# Patient Record
Sex: Male | Born: 1965 | Hispanic: No | Marital: Married | Smoking: Current every day smoker
Health system: Southern US, Community
[De-identification: ages and names within clinical notes are randomized; demographics above are authoritative.]

## PROBLEM LIST (undated history)

## (undated) DIAGNOSIS — K529 Noninfective gastroenteritis and colitis, unspecified: Secondary | ICD-10-CM

## (undated) HISTORY — PX: APPENDECTOMY: SHX54

---

## 2004-09-13 ENCOUNTER — Emergency Department (HOSPITAL_COMMUNITY): Admission: EM | Admit: 2004-09-13 | Discharge: 2004-09-13 | Payer: Self-pay | Admitting: Emergency Medicine

## 2004-09-29 ENCOUNTER — Ambulatory Visit: Payer: Self-pay | Admitting: Gastroenterology

## 2004-10-03 ENCOUNTER — Ambulatory Visit: Payer: Self-pay | Admitting: Gastroenterology

## 2006-03-22 ENCOUNTER — Ambulatory Visit: Payer: Self-pay | Admitting: Internal Medicine

## 2006-06-04 ENCOUNTER — Ambulatory Visit: Payer: Self-pay | Admitting: Internal Medicine

## 2006-06-04 LAB — CONVERTED CEMR LAB
ALT: 28 units/L (ref 0–40)
AST: 36 units/L (ref 0–37)
Albumin: 3.8 g/dL (ref 3.5–5.2)
Alkaline Phosphatase: 37 units/L — ABNORMAL LOW (ref 39–117)
BUN: 9 mg/dL (ref 6–23)
Basophils Absolute: 0 10*3/uL (ref 0.0–0.1)
Basophils Relative: 0 % (ref 0.0–1.0)
Bilirubin, Direct: 0.1 mg/dL (ref 0.0–0.3)
CO2: 30 meq/L (ref 19–32)
Calcium: 9.2 mg/dL (ref 8.4–10.5)
Chloride: 108 meq/L (ref 96–112)
Cholesterol: 219 mg/dL (ref 0–200)
Creatinine, Ser: 0.9 mg/dL (ref 0.4–1.5)
Direct LDL: 128.1 mg/dL
Eosinophils Absolute: 0.1 10*3/uL (ref 0.0–0.6)
Eosinophils Relative: 1.8 % (ref 0.0–5.0)
GFR calc Af Amer: 119 mL/min
GFR calc non Af Amer: 98 mL/min
Glucose, Bld: 103 mg/dL — ABNORMAL HIGH (ref 70–99)
HCT: 44.1 % (ref 39.0–52.0)
HDL: 53.9 mg/dL (ref >39.0)
Hemoglobin: 15.3 g/dL (ref 13.0–17.0)
Lymphocytes Relative: 27.1 % (ref 12.0–46.0)
MCHC: 34.7 g/dL (ref 30.0–36.0)
MCV: 92.7 fL (ref 78.0–100.0)
Monocytes Absolute: 0.5 10*3/uL (ref 0.2–0.7)
Monocytes Relative: 8.4 % (ref 3.0–11.0)
Neutro Abs: 3.9 10*3/uL (ref 1.4–7.7)
Neutrophils Relative %: 62.7 % (ref 43.0–77.0)
Platelets: 280 10*3/uL (ref 150–400)
Potassium: 4.1 meq/L (ref 3.5–5.1)
RBC: 4.75 M/uL (ref 4.22–5.81)
RDW: 14 % (ref 11.5–14.6)
Sodium: 141 meq/L (ref 135–145)
TSH: 2.06 microintl units/mL (ref 0.35–5.50)
Total Bilirubin: 0.9 mg/dL (ref 0.3–1.2)
Total CHOL/HDL Ratio: 4.1
Total Protein: 6.7 g/dL (ref 6.0–8.3)
Triglycerides: 180 mg/dL — ABNORMAL HIGH (ref 0–149)
VLDL: 36 mg/dL (ref 0–40)
WBC: 6.2 10*3/uL (ref 4.5–10.5)

## 2006-06-11 ENCOUNTER — Ambulatory Visit: Payer: Self-pay | Admitting: Internal Medicine

## 2006-06-11 DIAGNOSIS — E785 Hyperlipidemia, unspecified: Secondary | ICD-10-CM | POA: Insufficient documentation

## 2006-06-11 DIAGNOSIS — M109 Gout, unspecified: Secondary | ICD-10-CM

## 2006-09-06 DIAGNOSIS — F17200 Nicotine dependence, unspecified, uncomplicated: Secondary | ICD-10-CM | POA: Insufficient documentation

## 2007-06-11 ENCOUNTER — Encounter: Payer: Self-pay | Admitting: Internal Medicine

## 2009-03-22 ENCOUNTER — Emergency Department (HOSPITAL_COMMUNITY): Admission: EM | Admit: 2009-03-22 | Discharge: 2009-03-22 | Payer: Self-pay | Admitting: Emergency Medicine

## 2009-04-01 ENCOUNTER — Ambulatory Visit (HOSPITAL_COMMUNITY): Admission: RE | Admit: 2009-04-01 | Discharge: 2009-04-01 | Payer: Self-pay | Admitting: Specialist

## 2009-04-30 ENCOUNTER — Ambulatory Visit (HOSPITAL_BASED_OUTPATIENT_CLINIC_OR_DEPARTMENT_OTHER): Admission: RE | Admit: 2009-04-30 | Discharge: 2009-04-30 | Payer: Self-pay | Admitting: Specialist

## 2010-03-03 ENCOUNTER — Emergency Department (HOSPITAL_COMMUNITY): Payer: Managed Care, Other (non HMO)

## 2010-03-03 ENCOUNTER — Emergency Department (HOSPITAL_COMMUNITY)
Admission: EM | Admit: 2010-03-03 | Discharge: 2010-03-03 | Disposition: A | Discharge status: Home / Self Care | Payer: Managed Care, Other (non HMO) | Attending: Emergency Medicine | Admitting: Emergency Medicine

## 2010-03-03 DIAGNOSIS — G510 Bell's palsy: Secondary | ICD-10-CM | POA: Insufficient documentation

## 2010-03-03 DIAGNOSIS — R209 Unspecified disturbances of skin sensation: Secondary | ICD-10-CM | POA: Insufficient documentation

## 2010-03-03 DIAGNOSIS — R29810 Facial weakness: Secondary | ICD-10-CM | POA: Insufficient documentation

## 2010-03-03 LAB — CBC
MCHC: 33.7 g/dL (ref 30.0–36.0)
MCV: 96.8 fL (ref 78.0–100.0)
RBC: 4.44 MIL/uL (ref 4.22–5.81)
WBC: 8.3 10*3/uL (ref 4.0–10.5)

## 2010-03-03 LAB — APTT: aPTT: 26 seconds (ref 24–37)

## 2010-03-03 LAB — COMPREHENSIVE METABOLIC PANEL
ALT: 19 U/L (ref 0–53)
AST: 22 U/L (ref 0–37)
CO2: 24 mEq/L (ref 19–32)
Calcium: 9.2 mg/dL (ref 8.4–10.5)
Creatinine, Ser: 0.95 mg/dL (ref 0.4–1.5)
Glucose, Bld: 89 mg/dL (ref 70–99)
Sodium: 138 mEq/L (ref 135–145)

## 2010-03-03 LAB — DIFFERENTIAL
Eosinophils Relative: 2 % (ref 0–5)
Lymphocytes Relative: 28 % (ref 12–46)
Neutro Abs: 5.2 10*3/uL (ref 1.7–7.7)

## 2010-03-03 LAB — PROTIME-INR: INR: 0.97 (ref 0.00–1.49)

## 2010-04-13 LAB — POCT HEMOGLOBIN-HEMACUE: Hemoglobin: 17.2 g/dL — ABNORMAL HIGH (ref 13.0–17.0)

## 2010-04-14 LAB — SYNOVIAL CELL COUNT + DIFF, W/ CRYSTALS
Lymphocytes-Synovial Fld: 9 % (ref 0–20)
Monocyte-Macrophage-Synovial Fluid: 2 % — ABNORMAL LOW (ref 50–90)
Neutrophil, Synovial: 89 % — ABNORMAL HIGH (ref 0–25)

## 2010-04-14 LAB — BODY FLUID CULTURE: Culture: NO GROWTH

## 2010-04-23 ENCOUNTER — Emergency Department (HOSPITAL_COMMUNITY)
Admission: EM | Admit: 2010-04-23 | Discharge: 2010-04-23 | Disposition: A | Discharge status: Home / Self Care | Payer: Managed Care, Other (non HMO) | Attending: Emergency Medicine | Admitting: Emergency Medicine

## 2010-04-23 DIAGNOSIS — IMO0002 Reserved for concepts with insufficient information to code with codable children: Secondary | ICD-10-CM | POA: Insufficient documentation

## 2010-04-23 DIAGNOSIS — S91309A Unspecified open wound, unspecified foot, initial encounter: Secondary | ICD-10-CM | POA: Insufficient documentation

## 2010-04-23 DIAGNOSIS — Y92009 Unspecified place in unspecified non-institutional (private) residence as the place of occurrence of the external cause: Secondary | ICD-10-CM | POA: Insufficient documentation

## 2011-03-30 ENCOUNTER — Encounter (HOSPITAL_COMMUNITY): Payer: Self-pay | Admitting: Nurse Practitioner

## 2011-03-30 ENCOUNTER — Encounter (HOSPITAL_COMMUNITY): Admission: EM | Disposition: A | Discharge status: Home / Self Care | Payer: Self-pay | Source: Ambulatory Visit

## 2011-03-30 ENCOUNTER — Encounter (HOSPITAL_COMMUNITY): Payer: Self-pay | Admitting: Certified Registered Nurse Anesthetist

## 2011-03-30 ENCOUNTER — Emergency Department (HOSPITAL_COMMUNITY): Payer: Managed Care, Other (non HMO)

## 2011-03-30 ENCOUNTER — Emergency Department (HOSPITAL_COMMUNITY): Payer: Managed Care, Other (non HMO) | Admitting: Certified Registered Nurse Anesthetist

## 2011-03-30 ENCOUNTER — Inpatient Hospital Stay (HOSPITAL_COMMUNITY)
Admission: EM | Admit: 2011-03-30 | Discharge: 2011-04-03 | DRG: 340 | Disposition: A | Discharge status: Home / Self Care | Payer: Managed Care, Other (non HMO) | Source: Ambulatory Visit | Attending: Surgery | Admitting: Surgery

## 2011-03-30 DIAGNOSIS — K37 Unspecified appendicitis: Secondary | ICD-10-CM

## 2011-03-30 DIAGNOSIS — K3580 Unspecified acute appendicitis: Secondary | ICD-10-CM

## 2011-03-30 DIAGNOSIS — K352 Acute appendicitis with generalized peritonitis, without abscess: Principal | ICD-10-CM | POA: Diagnosis present

## 2011-03-30 DIAGNOSIS — K35209 Acute appendicitis with generalized peritonitis, without abscess, unspecified as to perforation: Principal | ICD-10-CM | POA: Diagnosis present

## 2011-03-30 DIAGNOSIS — R300 Dysuria: Secondary | ICD-10-CM | POA: Diagnosis present

## 2011-03-30 DIAGNOSIS — K3532 Acute appendicitis with perforation and localized peritonitis, without abscess: Secondary | ICD-10-CM | POA: Diagnosis present

## 2011-03-30 HISTORY — PX: LAPAROSCOPIC APPENDECTOMY: SHX408

## 2011-03-30 HISTORY — DX: Noninfective gastroenteritis and colitis, unspecified: K52.9

## 2011-03-30 LAB — URINALYSIS, ROUTINE W REFLEX MICROSCOPIC
Bilirubin Urine: NEGATIVE
Hgb urine dipstick: NEGATIVE
Ketones, ur: NEGATIVE mg/dL
Nitrite: NEGATIVE
Specific Gravity, Urine: 1.026 (ref 1.005–1.030)
pH: 5.5 (ref 5.0–8.0)

## 2011-03-30 LAB — POCT I-STAT, CHEM 8
Calcium, Ion: 1.14 mmol/L (ref 1.12–1.32)
Chloride: 108 mEq/L (ref 96–112)
Glucose, Bld: 94 mg/dL (ref 70–99)
TCO2: 24 mmol/L (ref 0–100)

## 2011-03-30 LAB — DIFFERENTIAL
Basophils Relative: 0 % (ref 0–1)
Eosinophils Relative: 2 % (ref 0–5)
Monocytes Absolute: 0.8 10*3/uL (ref 0.1–1.0)
Monocytes Relative: 8 % (ref 3–12)
Neutro Abs: 7.3 10*3/uL (ref 1.7–7.7)

## 2011-03-30 LAB — CBC
HCT: 40.2 % (ref 39.0–52.0)
Hemoglobin: 13 g/dL (ref 13.0–17.0)
MCH: 30.8 pg (ref 26.0–34.0)
MCH: 31.3 pg (ref 26.0–34.0)
MCHC: 32.3 g/dL (ref 30.0–36.0)
MCHC: 32.9 g/dL (ref 30.0–36.0)
MCV: 95.3 fL (ref 78.0–100.0)
MCV: 95.2 fL (ref 78.0–100.0)
Platelets: 252 10*3/uL (ref 150–400)
RBC: 4.18 MIL/uL — ABNORMAL LOW (ref 4.22–5.81)

## 2011-03-30 SURGERY — APPENDECTOMY, LAPAROSCOPIC
Anesthesia: General | Site: Abdomen | Wound class: Dirty or Infected

## 2011-03-30 MED ORDER — HYDROMORPHONE HCL PF 1 MG/ML IJ SOLN
INTRAMUSCULAR | Status: AC
  Administered 2011-03-30: 0.5 mg via INTRAVENOUS
  Filled 2011-03-30: qty 1

## 2011-03-30 MED ORDER — ROCURONIUM BROMIDE 100 MG/10ML IV SOLN
INTRAVENOUS | Status: DC | PRN
  Administered 2011-03-30: 10 mg via INTRAVENOUS
  Administered 2011-03-30: 25 mg via INTRAVENOUS

## 2011-03-30 MED ORDER — HYDROMORPHONE HCL PF 1 MG/ML IJ SOLN
0.5000 mg | INTRAMUSCULAR | Status: DC | PRN
  Administered 2011-03-31 – 2011-04-02 (×8): 1 mg via INTRAVENOUS
  Filled 2011-03-30 (×8): qty 1

## 2011-03-30 MED ORDER — MIDAZOLAM HCL 5 MG/5ML IJ SOLN
INTRAMUSCULAR | Status: DC | PRN
  Administered 2011-03-30: 2 mg via INTRAVENOUS

## 2011-03-30 MED ORDER — EPHEDRINE SULFATE 50 MG/ML IJ SOLN
INTRAMUSCULAR | Status: DC | PRN
  Administered 2011-03-30 (×2): 5 mg via INTRAVENOUS

## 2011-03-30 MED ORDER — ONDANSETRON HCL 4 MG/2ML IJ SOLN: 4.0000 mg | Freq: Four times a day (QID) | INTRAMUSCULAR | Status: DC | PRN

## 2011-03-30 MED ORDER — SODIUM CHLORIDE 0.9 % IR SOLN
Status: DC | PRN
  Administered 2011-03-30: 3000 mL

## 2011-03-30 MED ORDER — LIDOCAINE HCL 4 % MT SOLN
OROMUCOSAL | Status: DC | PRN
  Administered 2011-03-30: 4 mL via TOPICAL

## 2011-03-30 MED ORDER — BUPIVACAINE-EPINEPHRINE 0.25% -1:200000 IJ SOLN
INTRAMUSCULAR | Status: DC | PRN
  Administered 2011-03-30: 20 mL

## 2011-03-30 MED ORDER — MORPHINE SULFATE 4 MG/ML IJ SOLN
4.0000 mg | Freq: Once | INTRAMUSCULAR | Status: AC
  Administered 2011-03-30: 4 mg via INTRAVENOUS
  Filled 2011-03-30: qty 1

## 2011-03-30 MED ORDER — NEOSTIGMINE METHYLSULFATE 1 MG/ML IJ SOLN
INTRAMUSCULAR | Status: DC | PRN
  Administered 2011-03-30: 2.5 mg via INTRAVENOUS

## 2011-03-30 MED ORDER — SUCCINYLCHOLINE CHLORIDE 20 MG/ML IJ SOLN
INTRAMUSCULAR | Status: DC | PRN
  Administered 2011-03-30: 120 mg via INTRAVENOUS

## 2011-03-30 MED ORDER — SODIUM CHLORIDE 0.9 % IR SOLN
Status: DC | PRN
  Administered 2011-03-30: 1000 mL

## 2011-03-30 MED ORDER — NICOTINE 21 MG/24HR TD PT24
21.0000 mg | MEDICATED_PATCH | Freq: Every day | TRANSDERMAL | Status: DC | PRN
  Filled 2011-03-30 (×2): qty 1

## 2011-03-30 MED ORDER — FENTANYL CITRATE 0.05 MG/ML IJ SOLN
INTRAMUSCULAR | Status: DC | PRN
  Administered 2011-03-30 (×2): 50 ug via INTRAVENOUS
  Administered 2011-03-30: 100 ug via INTRAVENOUS
  Administered 2011-03-30: 50 ug via INTRAVENOUS
  Administered 2011-03-30: 100 ug via INTRAVENOUS
  Administered 2011-03-30 (×3): 50 ug via INTRAVENOUS

## 2011-03-30 MED ORDER — SODIUM CHLORIDE 0.9 % IV SOLN
1.0000 g | INTRAVENOUS | Status: DC | PRN
  Administered 2011-03-30: 1 g via INTRAVENOUS

## 2011-03-30 MED ORDER — IOHEXOL 300 MG/ML  SOLN
20.0000 mL | INTRAMUSCULAR | Status: AC
  Administered 2011-03-30 (×2): 20 mL via ORAL

## 2011-03-30 MED ORDER — SODIUM CHLORIDE 0.9 % IV SOLN
1.0000 g | INTRAVENOUS | Status: DC
  Administered 2011-03-31 – 2011-04-02 (×3): 1 g via INTRAVENOUS
  Filled 2011-03-30 (×4): qty 1

## 2011-03-30 MED ORDER — HEPARIN SODIUM (PORCINE) 5000 UNIT/ML IJ SOLN
5000.0000 [IU] | Freq: Three times a day (TID) | INTRAMUSCULAR | Status: DC
  Administered 2011-03-31 – 2011-04-03 (×10): 5000 [IU] via SUBCUTANEOUS
  Filled 2011-03-30 (×13): qty 1

## 2011-03-30 MED ORDER — HYDROMORPHONE HCL PF 1 MG/ML IJ SOLN
INTRAMUSCULAR | Status: AC
  Filled 2011-03-30: qty 1

## 2011-03-30 MED ORDER — ONDANSETRON HCL 4 MG PO TABS: 4.0000 mg | ORAL_TABLET | Freq: Four times a day (QID) | ORAL | Status: DC | PRN

## 2011-03-30 MED ORDER — HYDROMORPHONE HCL PF 1 MG/ML IJ SOLN
0.2500 mg | INTRAMUSCULAR | Status: DC | PRN
  Administered 2011-03-30 (×4): 0.5 mg via INTRAVENOUS

## 2011-03-30 MED ORDER — LACTATED RINGERS IV SOLN
INTRAVENOUS | Status: DC | PRN
  Administered 2011-03-30 (×2): via INTRAVENOUS

## 2011-03-30 MED ORDER — HYDROMORPHONE HCL PF 1 MG/ML IJ SOLN
0.5000 mg | INTRAMUSCULAR | Status: AC | PRN
  Administered 2011-03-30 (×4): 0.5 mg via INTRAVENOUS

## 2011-03-30 MED ORDER — PROPOFOL 10 MG/ML IV EMUL
INTRAVENOUS | Status: DC | PRN
  Administered 2011-03-30: 200 mg via INTRAVENOUS

## 2011-03-30 MED ORDER — HYDROMORPHONE HCL PF 1 MG/ML IJ SOLN
1.0000 mg | Freq: Once | INTRAMUSCULAR | Status: DC
  Filled 2011-03-30: qty 1

## 2011-03-30 MED ORDER — IOHEXOL 300 MG/ML  SOLN
100.0000 mL | Freq: Once | INTRAMUSCULAR | Status: AC | PRN
  Administered 2011-03-30: 100 mL via INTRAVENOUS

## 2011-03-30 MED ORDER — SODIUM CHLORIDE 0.9 % IV BOLUS (SEPSIS)
1000.0000 mL | Freq: Once | INTRAVENOUS | Status: AC
  Administered 2011-03-30: 1000 mL via INTRAVENOUS

## 2011-03-30 MED ORDER — OXYCODONE-ACETAMINOPHEN 5-325 MG PO TABS
1.0000 | ORAL_TABLET | ORAL | Status: DC | PRN
  Administered 2011-03-31 – 2011-04-03 (×10): 2 via ORAL
  Filled 2011-03-30 (×10): qty 2

## 2011-03-30 MED ORDER — ERTAPENEM SODIUM 1 G IJ SOLR
1.0000 g | INTRAMUSCULAR | Status: DC
  Filled 2011-03-30: qty 1

## 2011-03-30 MED ORDER — ONDANSETRON HCL 4 MG/2ML IJ SOLN
INTRAMUSCULAR | Status: DC | PRN
  Administered 2011-03-30: 4 mg via INTRAVENOUS

## 2011-03-30 MED ORDER — HYDRALAZINE HCL 20 MG/ML IJ SOLN
10.0000 mg | Freq: Four times a day (QID) | INTRAMUSCULAR | Status: DC | PRN
  Filled 2011-03-30: qty 0.5

## 2011-03-30 MED ORDER — GLYCOPYRROLATE 0.2 MG/ML IJ SOLN
INTRAMUSCULAR | Status: DC | PRN
  Administered 2011-03-30: .4 mg via INTRAVENOUS

## 2011-03-30 MED ORDER — DEXTROSE IN LACTATED RINGERS 5 % IV SOLN
INTRAVENOUS | Status: DC
  Administered 2011-03-30 – 2011-04-02 (×8): via INTRAVENOUS

## 2011-03-30 SURGICAL SUPPLY — 54 items
ADH SKN CLS APL DERMABOND .7 (GAUZE/BANDAGES/DRESSINGS) ×1
ADH SKN CLS LQ APL DERMABOND (GAUZE/BANDAGES/DRESSINGS) ×1
APPLIER CLIP ROT 10 11.4 M/L (STAPLE)
APR CLP MED LRG 11.4X10 (STAPLE)
BAG SPEC RTRVL LRG 6X4 10 (ENDOMECHANICALS) ×1
BLADE SURG ROTATE 9660 (MISCELLANEOUS) ×1 IMPLANT
CANISTER SUCTION 2500CC (MISCELLANEOUS) ×2 IMPLANT
CHLORAPREP W/TINT 26ML (MISCELLANEOUS) ×2 IMPLANT
CLIP APPLIE ROT 10 11.4 M/L (STAPLE) IMPLANT
CLOTH BEACON ORANGE TIMEOUT ST (SAFETY) ×2 IMPLANT
COVER SURGICAL LIGHT HANDLE (MISCELLANEOUS) ×2 IMPLANT
CUTTER LINEAR ENDO 35 ETS (STAPLE) IMPLANT
CUTTER LINEAR ENDO 35 ETS TH (STAPLE) ×1 IMPLANT
DECANTER SPIKE VIAL GLASS SM (MISCELLANEOUS) ×2 IMPLANT
DERMABOND ADHESIVE PROPEN (GAUZE/BANDAGES/DRESSINGS) ×1
DERMABOND ADVANCED (GAUZE/BANDAGES/DRESSINGS) ×1
DERMABOND ADVANCED .7 DNX12 (GAUZE/BANDAGES/DRESSINGS) ×1 IMPLANT
DERMABOND ADVANCED .7 DNX6 (GAUZE/BANDAGES/DRESSINGS) IMPLANT
DRAIN CHANNEL 19F RND (DRAIN) ×1 IMPLANT
DRAPE UTILITY 15X26 W/TAPE STR (DRAPE) ×6 IMPLANT
ELECT REM PT RETURN 9FT ADLT (ELECTROSURGICAL) ×2
ELECTRODE REM PT RTRN 9FT ADLT (ELECTROSURGICAL) ×1 IMPLANT
ENDOLOOP SUT PDS II  0 18 (SUTURE)
ENDOLOOP SUT PDS II 0 18 (SUTURE) IMPLANT
EVACUATOR SILICONE 100CC (DRAIN) ×1 IMPLANT
GLOVE BIO SURGEON STRL SZ7 (GLOVE) ×3 IMPLANT
GLOVE BIO SURGEON STRL SZ8 (GLOVE) ×3 IMPLANT
GLOVE BIOGEL PI IND STRL 7.5 (GLOVE) IMPLANT
GLOVE BIOGEL PI IND STRL 8 (GLOVE) ×1 IMPLANT
GLOVE BIOGEL PI INDICATOR 7.5 (GLOVE) ×2
GLOVE BIOGEL PI INDICATOR 8 (GLOVE) ×1
GOWN PREVENTION PLUS XLARGE (GOWN DISPOSABLE) ×3 IMPLANT
GOWN STRL NON-REIN LRG LVL3 (GOWN DISPOSABLE) ×4 IMPLANT
KIT BASIN OR (CUSTOM PROCEDURE TRAY) ×2 IMPLANT
KIT ROOM TURNOVER OR (KITS) ×2 IMPLANT
NS IRRIG 1000ML POUR BTL (IV SOLUTION) ×2 IMPLANT
PAD ARMBOARD 7.5X6 YLW CONV (MISCELLANEOUS) ×3 IMPLANT
POUCH SPECIMEN RETRIEVAL 10MM (ENDOMECHANICALS) ×2 IMPLANT
RELOAD /EVU35 (ENDOMECHANICALS) IMPLANT
RELOAD CUTTER ETS 35MM STAND (ENDOMECHANICALS) IMPLANT
SCALPEL HARMONIC ACE (MISCELLANEOUS) ×2 IMPLANT
SET IRRIG TUBING LAPAROSCOPIC (IRRIGATION / IRRIGATOR) ×2 IMPLANT
SPECIMEN JAR SMALL (MISCELLANEOUS) ×2 IMPLANT
SUT VIC AB 4-0 PS2 27 (SUTURE) ×2 IMPLANT
SWAB COLLECTION DEVICE MRSA (MISCELLANEOUS) IMPLANT
TOWEL OR 17X24 6PK STRL BLUE (TOWEL DISPOSABLE) ×2 IMPLANT
TOWEL OR 17X26 10 PK STRL BLUE (TOWEL DISPOSABLE) ×2 IMPLANT
TRAY FOLEY CATH 14FR (SET/KITS/TRAYS/PACK) ×2 IMPLANT
TRAY LAPAROSCOPIC (CUSTOM PROCEDURE TRAY) ×2 IMPLANT
TROCAR HASSON GELL 12X100 (TROCAR) ×2 IMPLANT
TROCAR Z-THREAD FIOS 12X100MM (TROCAR) ×2 IMPLANT
TROCAR Z-THREAD FIOS 5X100MM (TROCAR) ×2 IMPLANT
TUBE ANAEROBIC SPECIMEN COL (MISCELLANEOUS) IMPLANT
WATER STERILE IRR 1000ML POUR (IV SOLUTION) ×1 IMPLANT

## 2011-03-30 NOTE — ED Provider Notes (Signed)
History     CSN: [id number]  Arrival date & time 03/30/11  1039   First MD Initiated Contact with Patient 03/30/11 1228      Chief Complaint  Patient presents with  . Abdominal Pain    (Consider location/radiation/quality/duration/timing/severity/associated sxs/prior treatment) The history is provided by the patient. No language interpreter was used.    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 Patient states for the past 10 days he has been experiencing gradual onset of abdominal pain. He described pain as a tight, squeezing, twisting sensation worsen with movement. pain comes and goes.  Pain was significant last night that he was unable to sleep.  Pain can last for seconds and at times lasting for hours. He has nausea without vomiting. Patient states he normally has regular bowel movement but for the past several days he has a bowel movement change. State he has decreased bowel movement, with watery diarrhea. Nonbloody. He denies any fever, chest pain, shortness of breath, back pain, dysuria, testicular pain, testicular swelling, or change in appetite. Denies noticing any hernia  Past Medical History  Diagnosis Date  . Gastroenteritis     History reviewed. No pertinent past surgical history.  History reviewed. No pertinent family history.  History  Substance Use Topics  . Smoking status: Current Everyday Smoker -- 1.0 packs/day  . Smokeless tobacco: Not on file  . Alcohol Use: 12.6 oz/week    21 Cans of beer per week      Review of Systems  All other systems reviewed and are negative.    Allergies  Review of patient's allergies indicates no known allergies.  Home Medications  No current outpatient prescriptions on file.  BP 124/72  Pulse 96  Temp(Src) 98.4 F (36.9 C) (Oral)  Resp 20  Ht 5\' 9"  (1.753 m)  Wt 170 lb (77.111 kg)  BMI 25.10 kg/m2  SpO2 98%  Physical Exam  Nursing  note and vitals reviewed. Constitutional: He appears well-developed and well-nourished. No distress.       Awake, alert, nontoxic appearance  HENT:  Head: Atraumatic.  Eyes: Conjunctivae are normal. Right eye exhibits no discharge. Left eye exhibits no discharge.  Neck: Normal range of motion. Neck supple.  Cardiovascular: Normal rate and regular rhythm.   Pulmonary/Chest: Effort normal. No respiratory distress. He exhibits no tenderness.  Abdominal: Soft. There is tenderness in the right lower quadrant, suprapubic area and left lower quadrant. There is guarding. There is no rigidity, no rebound, no tenderness at McBurney's point and negative Murphy's sign. No hernia. Hernia confirmed negative in the ventral area, confirmed negative in the right inguinal area and confirmed negative in the left inguinal area.  Genitourinary: Testes normal and penis normal.    Circumcised.  Musculoskeletal: He exhibits no tenderness.       ROM appears intact, no obvious focal weakness  Neurological: He is alert.  Skin: Skin is warm and dry. No rash noted.  Psychiatric: He has a normal mood and affect.    ED Course  Procedures (including critical care time)   Labs Reviewed  URINALYSIS, ROUTINE W REFLEX MICROSCOPIC   No results found.   No diagnosis found.  Results for orders placed during the hospital encounter of 03/30/11  URINALYSIS, ROUTINE W REFLEX MICROSCOPIC      Component Value Range   Color, Urine YELLOW  YELLOW    APPearance CLEAR  CLEAR    Specific Gravity, Urine 1.026  1.005 - 1.030    pH 5.5  5.0 - 8.0    Glucose, UA NEGATIVE  NEGATIVE (mg/dL)   Hgb urine dipstick NEGATIVE  NEGATIVE    Bilirubin Urine NEGATIVE  NEGATIVE    Ketones, ur NEGATIVE  NEGATIVE (mg/dL)   Protein, ur NEGATIVE  NEGATIVE (mg/dL)   Urobilinogen, UA 0.2  0.0 - 1.0 (mg/dL)   Nitrite NEGATIVE  NEGATIVE    Leukocytes, UA NEGATIVE  NEGATIVE   CBC      Component Value Range   WBC 9.9  4.0 - 10.5 (K/uL)   RBC  4.22  4.22 - 5.81 (MIL/uL)   Hemoglobin 13.0  13.0 - 17.0 (g/dL)   HCT 45.4  09.8 - 11.9 (%)   MCV 95.3  78.0 - 100.0 (fL)   MCH 30.8  26.0 - 34.0 (pg)   MCHC 32.3  30.0 - 36.0 (g/dL)   RDW 14.7  82.9 - 56.2 (%)   Platelets 291  150 - 400 (K/uL)  DIFFERENTIAL      Component Value Range   Neutrophils Relative 74  43 - 77 (%)   Neutro Abs 7.3  1.7 - 7.7 (K/uL)   Lymphocytes Relative 16  12 - 46 (%)   Lymphs Abs 1.6  0.7 - 4.0 (K/uL)   Monocytes Relative 8  3 - 12 (%)   Monocytes Absolute 0.8  0.1 - 1.0 (K/uL)   Eosinophils Relative 2  0 - 5 (%)   Eosinophils Absolute 0.2  0.0 - 0.7 (K/uL)   Basophils Relative 0  0 - 1 (%)   Basophils Absolute 0.0  0.0 - 0.1 (K/uL)  OCCULT BLOOD, POC DEVICE      Component Value Range   Fecal Occult Bld NEGATIVE    POCT I-STAT, CHEM 8      Component Value Range   Sodium 141  135 - 145 (mEq/L)   Potassium 3.9  3.5 - 5.1 (mEq/L)   Chloride 108  96 - 112 (mEq/L)   BUN 12  6 - 23 (mg/dL)   Creatinine, Ser 1.30  0.50 - 1.35 (mg/dL)   Glucose, Bld 94  70 - 99 (mg/dL)   Calcium, Ion 8.65  7.84 - 1.32 (mmol/L)   TCO2 24  0 - 100 (mmol/L)   Hemoglobin 13.3  13.0 - 17.0 (g/dL)   HCT 69.6  29.5 - 28.4 (%)  CBC      Component Value Range   WBC 7.3  4.0 - 10.5 (K/uL)   RBC 4.18 (*) 4.22 - 5.81 (MIL/uL)   Hemoglobin 13.1  13.0 - 17.0 (g/dL)   HCT 13.2  44.0 - 10.2 (%)   MCV 95.2  78.0 - 100.0 (fL)   MCH 31.3  26.0 - 34.0 (pg)   MCHC 32.9  30.0 - 36.0 (g/dL)   RDW 72.5  36.6 - 44.0 (%)   Platelets 252  150 - 400 (K/uL)  CREATININE, SERUM      Component Value Range   Creatinine, Ser 0.92  0.50 - 1.35 (mg/dL)   GFR calc non Af Amer >90  >90 (mL/min)   GFR calc Af Amer >90  >90 (mL/min)  BASIC METABOLIC PANEL      Component Value Range   Sodium 138  135 - 145 (mEq/L)   Potassium 4.2  3.5 - 5.1 (mEq/L)   Chloride 105  96 - 112 (mEq/L)   CO2 26  19 - 32 (mEq/L)   Glucose, Bld 131 (*) 70 - 99 (mg/dL)  BUN 7  6 - 23 (mg/dL)   Creatinine, Ser 7.82   0.50 - 1.35 (mg/dL)   Calcium 8.4  8.4 - 95.6 (mg/dL)   GFR calc non Af Amer >90  >90 (mL/min)   GFR calc Af Amer >90  >90 (mL/min)  CBC      Component Value Range   WBC 7.2  4.0 - 10.5 (K/uL)   RBC 3.99 (*) 4.22 - 5.81 (MIL/uL)   Hemoglobin 12.3 (*) 13.0 - 17.0 (g/dL)   HCT 21.3 (*) 08.6 - 52.0 (%)   MCV 95.5  78.0 - 100.0 (fL)   MCH 30.8  26.0 - 34.0 (pg)   MCHC 32.3  30.0 - 36.0 (g/dL)   RDW 57.8  46.9 - 62.9 (%)   Platelets 252  150 - 400 (K/uL)   Ct Abdomen Pelvis W Contrast  03/30/2011  *RADIOLOGY REPORT*  Clinical Data: Lower abdominal pain.  Change in bowel movements.  CT ABDOMEN AND PELVIS WITH CONTRAST  Technique:  Multidetector CT imaging of the abdomen and pelvis was performed following the standard protocol during bolus administration of intravenous contrast.  Contrast: OMNIPAQUE IOHEXOL 300 MG/ML IJ SOLN  Comparison: No priors.  Findings:  Lung Bases: Unremarkable.  Abdomen/Pelvis:  The enhanced appearance of the liver, gallbladder, pancreas, spleen, bilateral adrenal glands and the left kidney is unremarkable.  In the interpolar collecting system of the right kidney there is a 2 mm nonobstructive calculus.  As well, there is a small exophytic intermediate attenuation (28 HU) lesion measuring 7 mm in diameter extending off the upper pole of the right kidney (too small to characterize).  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  The appendix appears markedly dilated and inflamed, and the distal aspect and tip of the appendix does not demonstrate normal enhancement, suggesting ischemia.  The distal aspect of the appendix measures up to 27 mm in diameter, and is surrounded by a large amount of adjacent inflammatory changes that resulting in thickening of the adjacent terminal ileum and the adjacent portions of the cecum.  Inflammatory changes are also noted in ascending the right pericolic gutter.  Extensive  ileocolic reactive adenopathy is also noted.  There is some surrounding periappendiceal fluid, but no well deformed abscess yet at this time.  No gross pneumoperitoneum.  No pathologic distension of bowel.  Other than the small amount of fluid the peritendinous heel region, there is no significant volume of ascites. Urinary bladder is unremarkable.  Musculoskeletal: There are no aggressive appearing lytic or blastic lesions noted in the visualized portions of the skeleton.  IMPRESSION: 1.  Findings compatible with severe acute appendicitis. Appearance could be compatible with recent rupture, although there is no gross pneumoperitoneum and no frank abscess yet at this time.  Emergent surgical consultation is highly recommended. 2.  Atherosclerosis. 3.  Nonobstructive 2 mm calculus in the lower pole collecting system of the right kidney. 4.  7 mm intermediate attenuation lesion in the right kidney is too small to characterize.  Critical Value/emergent results were called by telephone at the time of interpretation on 03/30/2011  at 04:57 p.m.  to  Dr. Doylene Canard in the Emergency Department, who verbally acknowledged these results.  Original Report Authenticated By: Florencia Reasons, M.D.       MDM  Lower abd pain, ddx hernia, torsion, diverticular disease, appy, UTI.  Doubt cardiopulmonary etiology.  Pt report given to Dr. Alto Denver at end of shift.  Plan to  reevaluate after CT scan result and dispo as appropriate.  Pt in NAD.  Afebrile with stable vital sign.   8:32 AM Pt has evidence of acute appendicitis requiring surgical intervention.       Fayrene Helper, PA-C 03/31/11 [phone]

## 2011-03-30 NOTE — ED Notes (Signed)
Pt taken to surgery via stretcher. 

## 2011-03-30 NOTE — Anesthesia Postprocedure Evaluation (Signed)
Anesthesia Post Note  Patient: Connor Hunt  Procedure(s) Performed: Procedure(s) (LRB): APPENDECTOMY LAPAROSCOPIC (N/A)  Anesthesia type: General  Patient location: PACU  Post pain: Pain level controlled and Adequate analgesia  Post assessment: Post-op Vital signs reviewed, Patient's Cardiovascular Status Stable, Respiratory Function Stable, Patent Airway and Pain level controlled  Last Vitals:  Filed Vitals:   03/30/11 2215  BP:   Pulse: 84  Temp: 36.4 C  Resp: 14    Post vital signs: Reviewed and stable  Level of consciousness: awake, alert  and oriented  Complications: No apparent anesthesia complications

## 2011-03-30 NOTE — H&P (Addendum)
Connor Hunt is an 46 y.o. male.   Chief Complaint: Right lower quadrant abdominal pain HPI: Patient presented to the emergency department today with a ten-day history of right lower quadrant abdominal pain. He has not had significant associated nausea. He has been having diarrhea. His appetite has been fairly normal. The pain continued to worsen, especially bad last night. He was evaluated in the emergency department. CT scan of the abdomen and pelvis demonstrates acute appendicitis.  Past Medical History  Diagnosis Date  . Gastroenteritis     PSHx: Knee surgery  History reviewed. No pertinent family history. Social History:  reports that he has been smoking.  He does not have any smokeless tobacco history on file. He reports that he drinks about 12.6 ounces of alcohol per week. He reports that he does not use illicit drugs.  Allergies: No Known Allergies  Medications Prior to Admission  Medication Dose Route Frequency Provider Last Rate Last Dose  . HYDROmorphone (DILAUDID) injection 1 mg  1 mg Intravenous Once Meagan Hunt, MD      . iohexol (OMNIPAQUE) 300 MG/ML solution 100 mL  100 mL Intravenous Once PRN Medication Radiologist, MD   100 mL at 03/30/11 1648  . iohexol (OMNIPAQUE) 300 MG/ML solution 20 mL  20 mL Oral Q1 Hr x 2 Medication Radiologist, MD   20 mL at 03/30/11 1430  . lactated ringers infusion    Continuous PRN Glendora Score, CRNA      . morphine 4 MG/ML injection 4 mg  4 mg Intravenous Once Fayrene Helper, PA-C   4 mg at 03/30/11 1500  . nicotine (NICODERM CQ - dosed in mg/24 hours) patch 21 mg  21 mg Transdermal Daily PRN Cyndra Numbers, MD      . sodium chloride 0.9 % bolus 1,000 mL  1,000 mL Intravenous Once Fayrene Helper, PA-C   1,000 mL at 03/30/11 1230  . DISCONTD: HYDROmorphone (DILAUDID) 1 MG/ML injection            No current outpatient prescriptions on file as of 03/30/2011.    Results for orders placed during the hospital encounter of 03/30/11 (from the past 48 hour(s))   CBC     Status: Normal   Collection Time   03/30/11  1:23 PM      Component Value Range Comment   WBC 9.9  4.0 - 10.5 (K/uL)    RBC 4.22  4.22 - 5.81 (MIL/uL)    Hemoglobin 13.0  13.0 - 17.0 (g/dL)    HCT 16.1  09.6 - 04.5 (%)    MCV 95.3  78.0 - 100.0 (fL)    MCH 30.8  26.0 - 34.0 (pg)    MCHC 32.3  30.0 - 36.0 (g/dL)    RDW 40.9  81.1 - 91.4 (%)    Platelets 291  150 - 400 (K/uL)   DIFFERENTIAL     Status: Normal   Collection Time   03/30/11  1:23 PM      Component Value Range Comment   Neutrophils Relative 74  43 - 77 (%)    Neutro Abs 7.3  1.7 - 7.7 (K/uL)    Lymphocytes Relative 16  12 - 46 (%)    Lymphs Abs 1.6  0.7 - 4.0 (K/uL)    Monocytes Relative 8  3 - 12 (%)    Monocytes Absolute 0.8  0.1 - 1.0 (K/uL)    Eosinophils Relative 2  0 - 5 (%)    Eosinophils Absolute 0.2  0.0 -  0.7 (K/uL)    Basophils Relative 0  0 - 1 (%)    Basophils Absolute 0.0  0.0 - 0.1 (K/uL)   OCCULT BLOOD, POC DEVICE     Status: Normal   Collection Time   03/30/11  1:30 PM      Component Value Range Comment   Fecal Occult Bld NEGATIVE     POCT I-STAT, CHEM 8     Status: Normal   Collection Time   03/30/11  1:42 PM      Component Value Range Comment   Sodium 141  135 - 145 (mEq/L)    Potassium 3.9  3.5 - 5.1 (mEq/L)    Chloride 108  96 - 112 (mEq/L)    BUN 12  6 - 23 (mg/dL)    Creatinine, Ser 4.09  0.50 - 1.35 (mg/dL)    Glucose, Bld 94  70 - 99 (mg/dL)    Calcium, Ion 8.11  1.12 - 1.32 (mmol/L)    TCO2 24  0 - 100 (mmol/L)    Hemoglobin 13.3  13.0 - 17.0 (g/dL)    HCT 91.4  78.2 - 95.6 (%)   URINALYSIS, ROUTINE W REFLEX MICROSCOPIC     Status: Normal   Collection Time   03/30/11  2:08 PM      Component Value Range Comment   Color, Urine YELLOW  YELLOW     APPearance CLEAR  CLEAR     Specific Gravity, Urine 1.026  1.005 - 1.030     pH 5.5  5.0 - 8.0     Glucose, UA NEGATIVE  NEGATIVE (mg/dL)    Hgb urine dipstick NEGATIVE  NEGATIVE     Bilirubin Urine NEGATIVE  NEGATIVE     Ketones, ur  NEGATIVE  NEGATIVE (mg/dL)    Protein, ur NEGATIVE  NEGATIVE (mg/dL)    Urobilinogen, UA 0.2  0.0 - 1.0 (mg/dL)    Nitrite NEGATIVE  NEGATIVE     Leukocytes, UA NEGATIVE  NEGATIVE  MICROSCOPIC NOT DONE ON URINES WITH NEGATIVE PROTEIN, BLOOD, LEUKOCYTES, NITRITE, OR GLUCOSE <1000 mg/dL.   Ct Abdomen Pelvis W Contrast  03/30/2011  *RADIOLOGY REPORT*  Clinical Data: Lower abdominal pain.  Change in bowel movements.  CT ABDOMEN AND PELVIS WITH CONTRAST  Technique:  Multidetector CT imaging of the abdomen and pelvis was performed following the standard protocol during bolus administration of intravenous contrast.  Contrast: OMNIPAQUE IOHEXOL 300 MG/ML IJ SOLN  Comparison: No priors.  Findings:  Lung Bases: Unremarkable.  Abdomen/Pelvis:  The enhanced appearance of the liver, gallbladder, pancreas, spleen, bilateral adrenal glands and the left kidney is unremarkable.  In the interpolar collecting system of the right kidney there is a 2 mm nonobstructive calculus.  As well, there is a small exophytic intermediate attenuation (28 HU) lesion measuring 7 mm in diameter extending off the upper pole of the right kidney (too small to characterize).  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  The appendix appears markedly dilated and inflamed, and the distal aspect and tip of the appendix does not demonstrate normal enhancement, suggesting ischemia.  The distal aspect of the appendix measures up to 27 mm in diameter, and is surrounded by a large amount of adjacent inflammatory changes that resulting in thickening of the adjacent terminal ileum and the adjacent portions of the cecum.  Inflammatory changes are also noted in ascending the right pericolic gutter.  Extensive ileocolic reactive adenopathy is also noted.  There is  some surrounding periappendiceal fluid, but no well deformed abscess yet at this time.  No gross pneumoperitoneum.  No  pathologic distension of bowel.  Other than the small amount of fluid the peritendinous heel region, there is no significant volume of ascites. Urinary bladder is unremarkable.  Musculoskeletal: There are no aggressive appearing lytic or blastic lesions noted in the visualized portions of the skeleton.  IMPRESSION: 1.  Findings compatible with severe acute appendicitis. Appearance could be compatible with recent rupture, although there is no gross pneumoperitoneum and no frank abscess yet at this time.  Emergent surgical consultation is highly recommended. 2.  Atherosclerosis. 3.  Nonobstructive 2 mm calculus in the lower pole collecting system of the right kidney. 4.  7 mm intermediate attenuation lesion in the right kidney is too small to characterize.  Critical Value/emergent results were called by telephone at the time of interpretation on 03/30/2011  at 04:57 p.m.  to  Dr. Doylene Canard in the Emergency Department, who verbally acknowledged these results.  Original Report Authenticated By: Florencia Reasons, M.D.    Review of Systems  Constitutional: Negative.   HENT: Negative.   Eyes: Negative.   Respiratory: Negative.   Cardiovascular: Negative.   Gastrointestinal: Positive for abdominal pain and diarrhea.  Genitourinary: Negative.   Musculoskeletal: Negative.   Skin: Negative.   Neurological: Negative.   Endo/Heme/Allergies: Negative.     Blood pressure 161/94, pulse 82, temperature 98.4 F (36.9 C), temperature source Oral, resp. rate 9, height 5\' 9"  (1.753 m), weight 77.111 kg (170 lb), SpO2 99.00%. Physical Exam  Constitutional: He is oriented to person, place, and time. He appears well-developed and well-nourished.  HENT:  Head: Normocephalic and atraumatic.  Eyes: EOM are normal. Pupils are equal, round, and reactive to light.  Neck: Normal range of motion. Neck supple. No tracheal deviation present.  Cardiovascular: Normal rate, regular rhythm, normal heart sounds and intact distal  pulses.   No murmur heard. Respiratory: Effort normal and breath sounds normal. No respiratory distress. He has no wheezes. He has no rales.  GI: Soft. He exhibits no distension. There is tenderness. There is no rebound and no guarding.       Tenderness in right lower quadrant  Musculoskeletal: Normal range of motion.  Neurological: He is alert and oriented to person, place, and time.     Assessment/Plan Acute appendicitis: I recommend IV antibiotics and laparoscopic appendectomy. Procedure, risks, and benefits were discussed in detail with the patient. Questions were answered. He is agreeable.  Eliodoro Gullett E 03/30/2011, 6:37 PM

## 2011-03-30 NOTE — Anesthesia Preprocedure Evaluation (Addendum)
Anesthesia Evaluation  Patient identified by MRN, date of birth, ID band Patient awake    Reviewed: Allergy & Precautions, H&P , NPO status , Patient's Chart, lab work & pertinent test results  Airway Mallampati: II TM Distance: >3 FB Neck ROM: full    Dental  (+) Teeth Intact and Dental Advisory Given   Pulmonary Current Smoker,          Cardiovascular     Neuro/Psych    GI/Hepatic ETOH approx. 21 beers per week per patient   Endo/Other    Renal/GU      Musculoskeletal   Abdominal   Peds  Hematology   Anesthesia Other Findings   Reproductive/Obstetrics                         Anesthesia Physical Anesthesia Plan  ASA: II  Anesthesia Plan: General   Post-op Pain Management:    Induction: Intravenous  Airway Management Planned: Oral ETT  Additional Equipment:   Intra-op Plan:   Post-operative Plan: Extubation in OR  Informed Consent: I have reviewed the patients History and Physical, chart, labs and discussed the procedure including the risks, benefits and alternatives for the proposed anesthesia with the patient or authorized representative who has indicated his/her understanding and acceptance.   Dental advisory given  Plan Discussed with: CRNA and Surgeon  Anesthesia Plan Comments:        Anesthesia Quick Evaluation

## 2011-03-30 NOTE — Op Note (Signed)
03/30/2011  8:23 PM  PATIENT:  Connor Hunt  46 y.o. male  PRE-OPERATIVE DIAGNOSIS:  appendicitis  POST-OPERATIVE DIAGNOSIS:  Ruptured Acute Appendicitis  PROCEDURE:  Procedure(s): APPENDECTOMY LAPAROSCOPIC  SURGEON:  Surgeon(s): Liz Malady, MD  PHYSICIAN ASSISTANT:   ASSISTANTS: none   ANESTHESIA:   general  EBL:  Total I/O In: 1300 [I.V.:1300] Out: 275 [Urine:175; Blood:100]  BLOOD ADMINISTERED:none  DRAINS: (1) Jackson-Pratt drain(s) with closed bulb suction in the RLQ   SPECIMEN:  Excision  DISPOSITION OF SPECIMEN:  PATHOLOGY  COUNTS:  YES  DICTATION: .Dragon Dictation patient presented to the emergency department with 10 days of right lower quadrant abdominal pain. CT scan demonstrated acute appendicitis. Patient is brought emergently to the operating room after receiving IV antibiotics. He was identified in the preop holding area. Informed consent was obtained. He was brought to the operating room. General anesthesia was administered by the anesthesia staff. Foley catheter was placed by nursing staff his abdomen was prepped and draped in a sterile fashion. Infraumbilical region was infiltrated with quarter percent Marcaine. Infra- umbilical incision was made. Subcutaneous tissues were dissected down. Anterior fascia was divided. Peritoneal cavity was entered under direct vision. 0 Vicryl pursestring suture was placed on the fascial opening. Hassan trocar was inserted into the abdomen. Abdomen was insufflated with carbon dioxide in standard fashion. Under direct vision the 12 mm left lower quadrant and a 5 mm right mid abdomen trocar was placed. Local was used at these port sites. Laparoscopic exploration revealed an inflamed perforated appendix with adherent ter Harmonic Scalpel. minal ileum in the right lower quadrant. The base was exposed and was normal. This was divided with Endo GIA with a vascular load. Staple line was intact. The appendix was ruptured and very  friable. The mesoappendix was divided as able with the harmonic scalpel. Specimen was placed in an Endo Catch bag and removed from the abdomen via the left lower quadrant port site. There remains some portions of the appendix adherent to the right lower quadrant. This was gently debrided away.  it was sent with specimen. Next the area was copiously irrigated with multiple liters of saline. There was a small amount of bleeding from the mesoappendix which was cauterized. The terminal ileum was inflamed but intact. Further irrigation was done. There was no more bleeding. 19 [place] Blake drain was placed in the right lower quadrant. This is brought out through the right port site. It was sutured to the skin with nylon. Irrigation fluid was evacuated and returned clear. The staple line was intact. Pneumoperitoneum was released. Ports were removed. Infraumbilical fascia was closed by tying the 0 Vicryl pursestring suture with care not to trap the intra-abdominal contents. Both wounds were copiously irrigated. Skin of each was closed with running 4-0 Vicryl subcuticular stitch followed by Dermabond. All counts were correct. A sterile dressing sponge was placed around the drain. Patient tolerated procedure well without apparent complication was taken recovery in stable condition.  PATIENT DISPOSITION:  PACU - hemodynamically stable.   Delay start of Pharmacological VTE agent (>24hrs) due to surgical blood loss or risk of bleeding:  no  Violeta Gelinas, MD, MPH, FACS Pager: [phone]  3/7/20138:23 PM

## 2011-03-30 NOTE — ED Notes (Signed)
Reports "tight squeezing pain under my intestines" onset 10 days ago, increasingly worse since, and so bad last night he couldn't sleep. Reports diarrhea over past week.

## 2011-03-30 NOTE — Anesthesia Procedure Notes (Signed)
Procedure Name: Intubation Date/Time: 03/30/2011 6:59 PM Performed by: Julianne Rice Z Pre-anesthesia Checklist: Patient identified, Timeout performed, Emergency Drugs available, Suction available and Patient being monitored Patient Re-evaluated:Patient Re-evaluated prior to inductionOxygen Delivery Method: Circle system utilized Preoxygenation: Pre-oxygenation with 100% oxygen Intubation Type: IV induction and Cricoid Pressure applied Laryngoscope Size: Mac and 3 Grade View: Grade I Tube type: Oral Tube size: 8.0 mm Number of attempts: 1 Airway Equipment and Method: Stylet and LTA kit utilized Placement Confirmation: ETT inserted through vocal cords under direct vision,  breath sounds checked- equal and bilateral and positive ETCO2 Secured at: 23 cm Tube secured with: Tape Dental Injury: Teeth and Oropharynx as per pre-operative assessment

## 2011-03-30 NOTE — Transfer of Care (Signed)
Immediate Anesthesia Transfer of Care Note  Patient: Connor Hunt  Procedure(s) Performed: Procedure(s) (LRB): APPENDECTOMY LAPAROSCOPIC (N/A)  Patient Location: PACU  Anesthesia Type: General  Level of Consciousness: awake, alert  and oriented  Airway & Oxygen Therapy: Patient Spontanous Breathing and Patient connected to nasal cannula oxygen  Post-op Assessment: Report given to PACU RN and Post -op Vital signs reviewed and stable  Post vital signs: Reviewed and stable  Complications: No apparent anesthesia complications

## 2011-03-30 NOTE — Preoperative (Signed)
Beta Blockers   Reason not to administer Beta Blockers:Not Applicable 

## 2011-03-30 NOTE — ED Provider Notes (Signed)
Patient is a 46 year old male with one week of lower abdominal pain as well as diarrhea without fevers, nausea, or vomiting he was seen initially by PA today. Patient had CT of the abdomen and pelvis pending at time of signout. CT results returned with evidence of acute appendicitis.  GI: Soft, positive bowel sounds, guarding and tenderness to palpation in the right lower quadrant as well as the suprapubic areas. Referral of pain to the left lower quadrant on palpation of the right lower quadrant  I spoke with Dr. Violeta Gelinas who is on call. He will see the patient in the emergency department and he will be taken to the OR for further management.  Cyndra Numbers, MD 03/30/11 [phone]

## 2011-03-31 ENCOUNTER — Encounter (HOSPITAL_COMMUNITY): Payer: Self-pay | Admitting: *Deleted

## 2011-03-31 LAB — BASIC METABOLIC PANEL
CO2: 26 mEq/L (ref 19–32)
Calcium: 8.4 mg/dL (ref 8.4–10.5)
Glucose, Bld: 131 mg/dL — ABNORMAL HIGH (ref 70–99)
Potassium: 4.2 mEq/L (ref 3.5–5.1)
Sodium: 138 mEq/L (ref 135–145)

## 2011-03-31 LAB — CBC
Hemoglobin: 12.3 g/dL — ABNORMAL LOW (ref 13.0–17.0)
MCH: 30.8 pg (ref 26.0–34.0)
MCV: 95.5 fL (ref 78.0–100.0)
RBC: 3.99 MIL/uL — ABNORMAL LOW (ref 4.22–5.81)

## 2011-03-31 NOTE — Progress Notes (Signed)
The patient is doing okay at this time.  CPM with IV antibiotics.  Marta Lamas. Gae Bon, MD, FACS [phone] [facility]

## 2011-03-31 NOTE — Progress Notes (Signed)
1 Day Post-Op  Subjective: No N/V but is having some pain in lower abdomen.  Is tolerating clears without any problems.  Also c/o burning with urination, s/p foley cath, no gross hematuria.   Objective: Vital signs in last 24 hours: Temp:  [97.4 F (36.3 C)-98.8 F (37.1 C)] 98.8 F (37.1 C) (03/08 0510) Pulse Rate:  [70-96] 83  (03/08 0510) Resp:  [9-20] 14  (03/08 0510) BP: (110-177)/(61-112) 130/74 mmHg (03/08 0510) SpO2:  [97 %-100 %] 100 % (03/08 0510) Weight:  [170 lb (77.111 kg)] 170 lb (77.111 kg) (03/07 2240)    Intake/Output from previous day: 03/07 0701 - 03/08 0700 In: 2605 [P.O.:240; I.V.:2365] Out: 1490 [Urine:575; Drains:815; Blood:100] Intake/Output this shift: Total I/O In: -  Out: 50 [Drains:50]  PE: Gen: NAD, very pleasant  Abd: mildly distended, diffuse TTP, minimal BS, drain in place with serosanguinous drainage, incisions c/d/i  Lab Results:   Basename 03/31/11 0530 03/30/11 2326  WBC 7.2 7.3  HGB 12.3* 13.1  HCT 38.1* 39.8  PLT 252 252   BMET  Basename 03/31/11 0530 03/30/11 2326 03/30/11 1342  NA 138 -- 141  K 4.2 -- 3.9  CL 105 -- 108  CO2 26 -- --  GLUCOSE 131* -- 94  BUN 7 -- 12  CREATININE 0.84 0.92 --  CALCIUM 8.4 -- --   PT/INR No results found for this basename: LABPROT:2,INR:2 in the last 72 hours   Studies/Results: Ct Abdomen Pelvis W Contrast  03/30/2011  *RADIOLOGY REPORT*  Clinical Data: Lower abdominal pain.  Change in bowel movements.  CT ABDOMEN AND PELVIS WITH CONTRAST  Technique:  Multidetector CT imaging of the abdomen and pelvis was performed following the standard protocol during bolus administration of intravenous contrast.  Contrast: OMNIPAQUE IOHEXOL 300 MG/ML IJ SOLN  Comparison: No priors.  Findings:  Lung Bases: Unremarkable.  Abdomen/Pelvis:  The enhanced appearance of the liver, gallbladder, pancreas, spleen, bilateral adrenal glands and the left kidney is unremarkable.  In the interpolar collecting  system of the right kidney there is a 2 mm nonobstructive calculus.  As well, there is a small exophytic intermediate attenuation (28 HU) lesion measuring 7 mm in diameter extending off the upper pole of the right kidney (too small to characterize).  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  The appendix appears markedly dilated and inflamed, and the distal aspect and tip of the appendix does not demonstrate normal enhancement, suggesting ischemia.  The distal aspect of the appendix measures up to 27 mm in diameter, and is surrounded by a large amount of adjacent inflammatory changes that resulting in thickening of the adjacent terminal ileum and the adjacent portions of the cecum.  Inflammatory changes are also noted in ascending the right pericolic gutter.  Extensive ileocolic reactive adenopathy is also noted.  There is some surrounding periappendiceal fluid, but no well deformed abscess yet at this time.  No gross pneumoperitoneum.  No pathologic distension of bowel.  Other than the small amount of fluid the peritendinous heel region, there is no significant volume of ascites. Urinary bladder is unremarkable.  Musculoskeletal: There are no aggressive appearing lytic or blastic lesions noted in the visualized portions of the skeleton.  IMPRESSION: 1.  Findings compatible with severe acute appendicitis. Appearance could be compatible with recent rupture, although there is no gross pneumoperitoneum and no frank abscess yet at this time.  Emergent surgical consultation is highly recommended. 2.  Atherosclerosis. 3.  Nonobstructive 2 mm calculus in the lower pole collecting system of the right kidney. 4.  7 mm intermediate attenuation lesion in the right kidney is too small to characterize.  Critical Value/emergent results were called by telephone at the time of interpretation on 03/30/2011  at 04:57 p.m.  to  Dr. Doylene Canard in the Emergency  Department, who verbally acknowledged these results.  Original Report Authenticated By: Florencia Reasons, M.D.    Anti-infectives: Anti-infectives     Start     Dose/Rate Route Frequency Ordered Stop   03/31/11 1800   ertapenem (INVANZ) 1 g in sodium chloride 0.9 % 50 mL IVPB        1 g 100 mL/hr over 30 Minutes Intravenous Every 24 hours 03/30/11 2250     03/30/11 1845   ertapenem (INVANZ) 1 g in sodium chloride 0.9 % 50 mL IVPB  Status:  Discontinued        1 g 100 mL/hr over 30 Minutes Intravenous To Surgery 03/30/11 1846 03/30/11 2225           Assessment/Plan 1. S/p laparoscopic appendectomy  2. Perforated appendicitis   Plan: 1. Patient now s/p lap appy for ruptured appendicitis with 26 French Blake drain in place in RLQ producing non-purulent, serosanguinous output, 865cc since placement.  Paint with Dilaudid and percocet PRN pain.  Continue IV invanz for antibiotic coverage.   2. Tolerating clear liquid diet well, without N/V.  No flatus or BM yet.  Will advance once improved bowel activity.   3. Dysuria, likely 2/2 trauma of foley cath.  Will continue to monitor.  Consider UA if not spontaneously improving.  No gross hematuria per pt report.     LOS: 1 day    Connor Hunt 03/31/2011, 8:25 AM

## 2011-04-01 NOTE — ED Provider Notes (Signed)
Medical screening examination/treatment/procedure(s) were performed by non-physician practitioner and as supervising physician I was immediately available for consultation/collaboration.   Gerhard Munch, MD 04/01/11 1940

## 2011-04-01 NOTE — Progress Notes (Signed)
2 Days Post-Op  Subjective: Feels okay. No nausea. Pain well controlled. Tolerating liquids but not really hungry. Has not had a bowel movement or passed gas. Has been ambulating in the halls.  Objective: Vital signs in last 24 hours: Temp:  [98.4 F (36.9 C)-99.7 F (37.6 C)] 98.4 F (36.9 C) (03/09 0535) Pulse Rate:  [73-85] 73  (03/09 0535) Resp:  [14-16] 14  (03/09 0535) BP: (98-132)/(61-68) 124/68 mmHg (03/09 0535) SpO2:  [92 %-96 %] 93 % (03/09 0535)   Intake/Output from previous day: 03/08 0701 - 03/09 0700 In: 3735 [P.O.:720; I.V.:2915; IV Piggyback:100] Out: 2770 [Urine:2400; Drains:370] Intake/Output this shift:     General appearance: alert, cooperative, appears stated age and no distress Resp: clear to auscultation bilaterally GI: abdomen still a little tight. Only a rare bowel sound. No other issues noted. Not particularly tender.  Incision: incisions appear clean. JP is still putting out large amounts of serous fluid. Does not appear infected.  Lab Results:   Basename 03/31/11 0530 03/30/11 2326  WBC 7.2 7.3  HGB 12.3* 13.1  HCT 38.1* 39.8  PLT 252 252   BMET  Basename 03/31/11 0530 03/30/11 2326 03/30/11 1342  NA 138 -- 141  K 4.2 -- 3.9  CL 105 -- 108  CO2 26 -- --  GLUCOSE 131* -- 94  BUN 7 -- 12  CREATININE 0.84 0.92 --  CALCIUM 8.4 -- --   PT/INR No results found for this basename: LABPROT:2,INR:2 in the last 72 hours ABG No results found for this basename: PHART:2,PCO2:2,PO2:2,HCO3:2 in the last 72 hours  MEDS, Scheduled    . ertapenem  1 g Intravenous Q24H  . heparin  5,000 Units Subcutaneous Q8H    Studies/Results: Ct Abdomen Pelvis W Contrast  03/30/2011  *RADIOLOGY REPORT*  Clinical Data: Lower abdominal pain.  Change in bowel movements.  CT ABDOMEN AND PELVIS WITH CONTRAST  Technique:  Multidetector CT imaging of the abdomen and pelvis was performed following the standard protocol during bolus administration of intravenous  contrast.  Contrast: OMNIPAQUE IOHEXOL 300 MG/ML IJ SOLN  Comparison: No priors.  Findings:  Lung Bases: Unremarkable.  Abdomen/Pelvis:  The enhanced appearance of the liver, gallbladder, pancreas, spleen, bilateral adrenal glands and the left kidney is unremarkable.  In the interpolar collecting system of the right kidney there is a 2 mm nonobstructive calculus.  As well, there is a small exophytic intermediate attenuation (28 HU) lesion measuring 7 mm in diameter extending off the upper pole of the right kidney (too small to characterize).  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  The appendix appears markedly dilated and inflamed, and the distal aspect and tip of the appendix does not demonstrate normal enhancement, suggesting ischemia.  The distal aspect of the appendix measures up to 27 mm in diameter, and is surrounded by a large amount of adjacent inflammatory changes that resulting in thickening of the adjacent terminal ileum and the adjacent portions of the cecum.  Inflammatory changes are also noted in ascending the right pericolic gutter.  Extensive ileocolic reactive adenopathy is also noted.  There is some surrounding periappendiceal fluid, but no well deformed abscess yet at this time.  No gross pneumoperitoneum.  No pathologic distension of bowel.  Other than the small amount of fluid the peritendinous heel region, there is no significant volume of ascites. Urinary bladder is unremarkable.  Musculoskeletal: There are no aggressive appearing lytic or blastic lesions noted in the visualized  portions of the skeleton.  IMPRESSION: 1.  Findings compatible with severe acute appendicitis. Appearance could be compatible with recent rupture, although there is no gross pneumoperitoneum and no frank abscess yet at this time.  Emergent surgical consultation is highly recommended. 2.  Atherosclerosis. 3.  Nonobstructive 2 mm calculus in  the lower pole collecting system of the right kidney. 4.  7 mm intermediate attenuation lesion in the right kidney is too small to characterize.  Critical Value/emergent results were called by telephone at the time of interpretation on 03/30/2011  at 04:57 p.m.  to  Dr. Doylene Canard in the Emergency Department, who verbally acknowledged these results.  Original Report Authenticated By: Florencia Reasons, M.D.    Assessment: s/p Procedure(s): APPENDECTOMY LAPAROSCOPIC Slow progress.  Plan: Will continue on IV antibiotics, clear liquid diet and await bowel function to advance diet. Will recheck CBC in a.m.   LOS: 2 days     Currie Paris, MD, [facility], [place] [phone]   04/01/2011 8:14 AM

## 2011-04-02 LAB — BASIC METABOLIC PANEL
BUN: 3 mg/dL — ABNORMAL LOW (ref 6–23)
Creatinine, Ser: 0.84 mg/dL (ref 0.50–1.35)
GFR calc Af Amer: >90 mL/min (ref >90)
GFR calc non Af Amer: >90 mL/min (ref >90)

## 2011-04-02 LAB — CBC
HCT: 34.7 % — ABNORMAL LOW (ref 39.0–52.0)
MCHC: 32.9 g/dL (ref 30.0–36.0)
MCV: 94.3 fL (ref 78.0–100.0)
RDW: 14.4 % (ref 11.5–15.5)

## 2011-04-02 MED ORDER — POTASSIUM CHLORIDE 20 MEQ/15ML (10%) PO LIQD
20.0000 meq | Freq: Once | ORAL | Status: AC
  Administered 2011-04-02: 20 meq via ORAL
  Filled 2011-04-02: qty 15

## 2011-04-02 MED ORDER — BIOTENE DRY MOUTH MT LIQD
15.0000 mL | Freq: Two times a day (BID) | OROMUCOSAL | Status: DC
  Administered 2011-04-02 (×2): 15 mL via OROMUCOSAL

## 2011-04-02 MED ORDER — CHLORHEXIDINE GLUCONATE 0.12 % MT SOLN: 15.0000 mL | Freq: Two times a day (BID) | OROMUCOSAL | Status: DC

## 2011-04-02 NOTE — Progress Notes (Signed)
3 Days Post-Op  Subjective: He feels better every day. He is tolerating clear liquids and having no nausea. He has been ambulating a fair amount. He has started to pass gas. He does not feel distended.He would like a little bit more to eat.  Objective: Vital signs in last 24 hours: Temp:  [98.2 F (36.8 C)-98.7 F (37.1 C)] 98.2 F (36.8 C) (03/10 0531) Pulse Rate:  [67-81] 67  (03/10 0531) Resp:  [16-17] 17  (03/10 0531) BP: (130-153)/(71-81) 130/71 mmHg (03/10 0531) SpO2:  [94 %-98 %] 94 % (03/10 0531)   Intake/Output from previous day: 03/09 0701 - 03/10 0700 In: 2887 [I.V.:2887] Out: 2420 [Urine:2125; Drains:295] Intake/Output this shift: Total I/O In: -  Out: 300 [Urine:250; Drains:50]   General appearance: alert and no distress Resp: clear to auscultation bilaterally Cardio: regular rate and rhythm, S1, S2 normal, no murmur, click, rub or gallop GI: soft and not distended today. JP is still thin. Minimal incisional tenderness. Bowel sounds present.  Incision: healing well  Lab Results:   Basename 03/31/11 0530 03/30/11 2326  WBC 7.2 7.3  HGB 12.3* 13.1  HCT 38.1* 39.8  PLT 252 252   BMET  Basename 03/31/11 0530 03/30/11 2326 03/30/11 1342  NA 138 -- 141  K 4.2 -- 3.9  CL 105 -- 108  CO2 26 -- --  GLUCOSE 131* -- 94  BUN 7 -- 12  CREATININE 0.84 0.92 --  CALCIUM 8.4 -- --   PT/INR No results found for this basename: LABPROT:2,INR:2 in the last 72 hours ABG No results found for this basename: PHART:2,PCO2:2,PO2:2,HCO3:2 in the last 72 hours  MEDS, Scheduled    . antiseptic oral rinse  15 mL Mouth Rinse q12n4p  . chlorhexidine  15 mL Mouth Rinse BID  . ertapenem  1 g Intravenous Q24H  . heparin  5,000 Units Subcutaneous Q8H    Studies/Results: No results found.  Assessment: s/p Procedure(s): APPENDECTOMY LAPAROSCOPIC Progressing as planned and expected.  Plan: Advance diet If diet is tolerated he may be able to be discharged  tomorrow.   LOS: 3 days     Currie Paris, MD, [facility] Surgery, [place] [phone]   04/02/2011 7:58 AM

## 2011-04-03 MED ORDER — AMOXICILLIN-POT CLAVULANATE 875-125 MG PO TABS: 1.0000 | ORAL_TABLET | Freq: Two times a day (BID) | ORAL | Status: DC

## 2011-04-03 MED ORDER — OXYCODONE-ACETAMINOPHEN 5-325 MG PO TABS: 1.0000 | ORAL_TABLET | ORAL | Status: DC | PRN

## 2011-04-03 NOTE — Discharge Summary (Signed)
Physician Discharge Summary  Patient ID: Connor Hunt MRN: [id number] DOB/AGE: 1966/01/21 46 y.o.  Admit date: 03/30/2011 Discharge date: 04/03/2011  Admission Diagnoses: Acute perforated appendicitis   Discharge Diagnoses:  Principal Problem:  *Acute appendicitis with rupture   Discharged Condition: good  Hospital Course: 46 yo M who presented with 10 day history of RLQ abdominal pain found to have acute perforated appendicitis requiring laparoscopic appendectomy with JP drain insertion.  He was started on Invanz for abscess prophylaxis.  Patient tolerated the surgery well and was able to quickly advance his diet.  Pain was well controlled with both IV and po pain medications.  On the day of discharge, patient was ambulating the halls with pain well controlled with po pain medications and tolerating a full diet.  Patient was discharged home with an additional 7 days of po Augmentin.   Consults: None  Significant Diagnostic Studies: radiology: CT scan: Findings compatible with severe acute appendicitis. Appearance  could be compatible with recent rupture, although there is no gross pneumoperitoneum and no frank abscess yet at this time   Treatments: IV hydration, antibiotics: Invanz and analgesia: Dilaudid and percocet  Discharge Exam: Blood pressure 139/72, pulse 63, temperature 97.8 F (36.6 C), temperature source Oral, resp. rate 18, height 5\' 9"  (1.753 m), weight 170 lb (77.111 kg), SpO2 97.00%. Gen: alert, oriented, NAD, up and walking CV: RRR, no murmur Pulm: clear Abd: soft, minimal tenderness, JP drain in place without erythema or exudate, +BS Ext: warm, no edema   Disposition: 01-Home or Self Care  Discharge Orders    Future Orders Please Complete By Expires   Diet - low sodium heart healthy      Increase activity slowly      Leave dressing on - Keep it clean, dry, and intact until clinic visit      Call MD for:  temperature >100.4      Call MD for:  persistant  nausea and vomiting      Call MD for:  severe uncontrolled pain      Call MD for:  redness, tenderness, or signs of infection (pain, swelling, redness, odor or green/yellow discharge around incision site)      Call MD for:  persistant dizziness or light-headedness      Care order/instruction      Comments:   JP drain teaching     Medication List  As of 04/03/2011  9:08 AM   TAKE these medications         amoxicillin-clavulanate 875-125 MG per tablet   Commonly known as: AUGMENTIN   Take 1 tablet by mouth 2 (two) times daily.      oxyCODONE-acetaminophen 5-325 MG per tablet   Commonly known as: PERCOCET   Take 1-2 tablets by mouth every 4 (four) hours as needed.           Follow-up Information    Follow up with [facility], MD. Schedule an appointment as soon as possible for a visit in 1 week. (leave your drain in place until your follow up appt)    Contact information:   [facility] [address] [place] [address] [phone]          Signed: Demetria Pore 04/03/2011, 9:08 AM

## 2011-04-03 NOTE — Progress Notes (Signed)
DC instructions given to pt.. Pt understands how to care for JP drain. Supplies sent home with pt.. No questions or concerns per pt.Connor Hunt

## 2011-04-03 NOTE — Discharge Summary (Signed)
Doing well ready for discharge 

## 2011-04-03 NOTE — Discharge Instructions (Signed)
[facility] ______[facility] °LAPAROSCOPIC SURGERY: POST OP INSTRUCTIONS °Always review your discharge instruction sheet given to you by the facility where your surgery was performed. °IF YOU HAVE DISABILITY OR FAMILY LEAVE FORMS, YOU MUST BRING THEM TO THE OFFICE FOR PROCESSING.   °DO NOT GIVE THEM TO YOUR DOCTOR. ° °1. A prescription for pain medication may be given to you upon discharge.  Take your pain medication as prescribed, if needed.  If narcotic pain medicine is not needed, then you may take acetaminophen (Tylenol) or ibuprofen (Advil) as needed. °2. Take your usually prescribed medications unless otherwise directed. °3. If you need a refill on your pain medication, please contact your pharmacy.  They will contact our office to request authorization. Prescriptions will not be filled after 5pm or on week-ends. °4. You should follow a light diet the first few days after arrival home, such as soup and crackers, etc.  Be sure to include lots of fluids daily. °5. Most patients will experience some swelling and bruising in the area of the incisions.  Ice packs will help.  Swelling and bruising can take several days to resolve.  °6. It is common to experience some constipation if taking pain medication after surgery.  Increasing fluid intake and taking a stool softener (such as Colace) will usually help or prevent this problem from occurring.  A mild laxative (Milk of Magnesia or Miralax) should be taken according to package instructions if there are no bowel movements after 48 hours. °7. Unless discharge instructions indicate otherwise, you may remove your bandages 24-48 hours after surgery, and you may shower at that time.  You may have steri-strips (small skin tapes) in place directly over the incision.  These strips should be left on the skin for 7-10 days.  If your surgeon used skin glue on the incision, you may shower in 24 hours.  The glue will flake off over the next 2-3 weeks.  Any sutures or  staples will be removed at the office during your follow-up visit. °8. ACTIVITIES:  You may resume regular (light) daily activities beginning the next day--such as daily self-care, walking, climbing stairs--gradually increasing activities as tolerated.  You may have sexual intercourse when it is comfortable.  Refrain from any heavy lifting or straining until approved by your doctor. °a. You may drive when you are no longer taking prescription pain medication, you can comfortably wear a seatbelt, and you can safely maneuver your car and apply brakes. °b. RETURN TO WORK:  __________________________________________________________ °9. You should see your doctor in the office for a follow-up appointment approximately 2-3 weeks after your surgery.  Make sure that you call for this appointment within a day or two after you arrive home to insure a convenient appointment time. °10. OTHER INSTRUCTIONS: __________________________________________________________________________________________________________________________ __________________________________________________________________________________________________________________________ °WHEN TO CALL YOUR DOCTOR: °1. Fever over 101.0 °2. Inability to urinate °3. Continued bleeding from incision. °4. Increased pain, redness, or drainage from the incision. °5. Increasing abdominal pain ° °The clinic staff is available to answer your questions during regular business hours.  Please don’t hesitate to call and ask to speak to one of the nurses for clinical concerns.  If you have a medical emergency, go to the nearest emergency room or call 911.  A surgeon from [facility] is always on call at the hospital. °[address], [address], [place], [place]  [address] ? [address] [address], [place], [place]   [address] °[phone] ? [phone] ? FAX [phone] °Web site:   [email or web address] °

## 2011-04-04 ENCOUNTER — Telehealth (INDEPENDENT_AMBULATORY_CARE_PROVIDER_SITE_OTHER): Payer: Self-pay | Admitting: General Surgery

## 2011-04-04 NOTE — Telephone Encounter (Signed)
I made an appt for 04/12/11 and called the pt.

## 2011-04-12 ENCOUNTER — Ambulatory Visit (INDEPENDENT_AMBULATORY_CARE_PROVIDER_SITE_OTHER): Payer: Managed Care, Other (non HMO) | Admitting: General Surgery

## 2011-04-12 ENCOUNTER — Encounter (INDEPENDENT_AMBULATORY_CARE_PROVIDER_SITE_OTHER): Payer: Self-pay

## 2011-04-12 ENCOUNTER — Encounter (INDEPENDENT_AMBULATORY_CARE_PROVIDER_SITE_OTHER): Payer: Self-pay | Admitting: General Surgery

## 2011-04-12 VITALS — BP 126/84 | HR 64 | Temp 98.0°F | Resp 16 | Ht 69.0 in | Wt 152.0 lb

## 2011-04-12 DIAGNOSIS — K352 Acute appendicitis with generalized peritonitis, without abscess: Secondary | ICD-10-CM

## 2011-04-12 DIAGNOSIS — K3532 Acute appendicitis with perforation and localized peritonitis, without abscess: Secondary | ICD-10-CM

## 2011-04-12 DIAGNOSIS — K35209 Acute appendicitis with generalized peritonitis, without abscess, unspecified as to perforation: Secondary | ICD-10-CM

## 2011-04-12 NOTE — Progress Notes (Signed)
Subjective:     Patient ID: Connor Hunt, male   DOB: [birth year], 46 y.o.   MRN: [id number]  HPI Patient presents status post laparoscopic appendectomy for perforated appendicitis. His drain is in place. It is putting out only 7-15 cc per day for the past several days.  Review of Systems     Objective:   Physical Exam Abdomen soft and nontender. Incisions are healed. JP drain was removed without difficulty. A small dressing was applied.    Assessment:     Doing well status post laparoscopic appendectomy for perforated appendicitis    Plan:     Patient completed his antibiotics. His drain was removed. He needs to avoid heavy lifting for a total of 4 weeks after surgery. We'll see him back p.r.n.

## 2015-04-19 ENCOUNTER — Other Ambulatory Visit (HOSPITAL_COMMUNITY): Payer: Self-pay | Admitting: Specialist

## 2015-04-19 ENCOUNTER — Ambulatory Visit (HOSPITAL_COMMUNITY)
Admission: RE | Admit: 2015-04-19 | Discharge: 2015-04-19 | Disposition: A | Discharge status: Home / Self Care | Payer: BLUE CROSS/BLUE SHIELD | Source: Ambulatory Visit | Attending: Cardiovascular Disease | Admitting: Cardiovascular Disease

## 2015-04-19 DIAGNOSIS — M7989 Other specified soft tissue disorders: Secondary | ICD-10-CM | POA: Diagnosis not present

## 2015-04-19 DIAGNOSIS — I8391 Asymptomatic varicose veins of right lower extremity: Secondary | ICD-10-CM | POA: Diagnosis not present

## 2015-04-19 DIAGNOSIS — M79604 Pain in right leg: Secondary | ICD-10-CM | POA: Diagnosis not present

## 2019-08-12 ENCOUNTER — Encounter (HOSPITAL_COMMUNITY): Payer: BLUE CROSS/BLUE SHIELD

## 2021-05-17 ENCOUNTER — Emergency Department (HOSPITAL_COMMUNITY)
Admission: EM | Admit: 2021-05-17 | Discharge: 2021-05-17 | Disposition: A | Discharge status: Home / Self Care | Payer: No Typology Code available for payment source | Attending: Emergency Medicine | Admitting: Emergency Medicine

## 2021-05-17 ENCOUNTER — Other Ambulatory Visit: Payer: Self-pay

## 2021-05-17 ENCOUNTER — Encounter (HOSPITAL_COMMUNITY): Payer: Self-pay | Admitting: Emergency Medicine

## 2021-05-17 ENCOUNTER — Emergency Department (HOSPITAL_COMMUNITY): Payer: No Typology Code available for payment source

## 2021-05-17 DIAGNOSIS — R55 Syncope and collapse: Secondary | ICD-10-CM | POA: Insufficient documentation

## 2021-05-17 DIAGNOSIS — Y92410 Unspecified street and highway as the place of occurrence of the external cause: Secondary | ICD-10-CM | POA: Insufficient documentation

## 2021-05-17 LAB — COMPREHENSIVE METABOLIC PANEL
ALT: 33 U/L (ref 0–44)
AST: 34 U/L (ref 15–41)
Albumin: 3.5 g/dL (ref 3.5–5.0)
Alkaline Phosphatase: 58 U/L (ref 38–126)
Anion gap: 14 (ref 5–15)
BUN: 12 mg/dL (ref 6–20)
CO2: 19 mmol/L — ABNORMAL LOW (ref 22–32)
Calcium: 8.7 mg/dL — ABNORMAL LOW (ref 8.9–10.3)
Chloride: 104 mmol/L (ref 98–111)
Creatinine, Ser: 1.33 mg/dL — ABNORMAL HIGH (ref 0.61–1.24)
GFR, Estimated: >60 mL/min (ref >60)
Glucose, Bld: 110 mg/dL — ABNORMAL HIGH (ref 70–99)
Potassium: 3.8 mmol/L (ref 3.5–5.1)
Sodium: 137 mmol/L (ref 135–145)
Total Bilirubin: 1.5 mg/dL — ABNORMAL HIGH (ref 0.3–1.2)
Total Protein: 6.2 g/dL — ABNORMAL LOW (ref 6.5–8.1)

## 2021-05-17 LAB — CBC WITH DIFFERENTIAL/PLATELET
Abs Immature Granulocytes: 0.1 10*3/uL — ABNORMAL HIGH (ref 0.00–0.07)
Basophils Absolute: 0 10*3/uL (ref 0.0–0.1)
Basophils Relative: 1 %
Eosinophils Absolute: 0.1 10*3/uL (ref 0.0–0.5)
Eosinophils Relative: 1 %
HCT: 39.7 % (ref 39.0–52.0)
Hemoglobin: 13.1 g/dL (ref 13.0–17.0)
Immature Granulocytes: 1 %
Lymphocytes Relative: 17 %
Lymphs Abs: 1.3 10*3/uL (ref 0.7–4.0)
MCH: 34 pg (ref 26.0–34.0)
MCHC: 33 g/dL (ref 30.0–36.0)
MCV: 103.1 fL — ABNORMAL HIGH (ref 80.0–100.0)
Monocytes Absolute: 0.5 10*3/uL (ref 0.1–1.0)
Monocytes Relative: 6 %
Neutro Abs: 5.4 10*3/uL (ref 1.7–7.7)
Neutrophils Relative %: 74 %
Platelets: 234 10*3/uL (ref 150–400)
RBC: 3.85 MIL/uL — ABNORMAL LOW (ref 4.22–5.81)
RDW: 15.6 % — ABNORMAL HIGH (ref 11.5–15.5)
WBC: 7.4 10*3/uL (ref 4.0–10.5)
nRBC: 0 % (ref 0.0–0.2)

## 2021-05-17 LAB — URINALYSIS, ROUTINE W REFLEX MICROSCOPIC
Bilirubin Urine: NEGATIVE
Glucose, UA: NEGATIVE mg/dL
Hgb urine dipstick: NEGATIVE
Ketones, ur: 20 mg/dL — AB
Leukocytes,Ua: NEGATIVE
Nitrite: NEGATIVE
Protein, ur: NEGATIVE mg/dL
Specific Gravity, Urine: 1.02 (ref 1.005–1.030)
pH: 5 (ref 5.0–8.0)

## 2021-05-17 LAB — CBG MONITORING, ED
Glucose-Capillary: 113 mg/dL — ABNORMAL HIGH (ref 70–99)
Glucose-Capillary: 110 mg/dL — ABNORMAL HIGH (ref 70–99)

## 2021-05-17 LAB — TROPONIN I (HIGH SENSITIVITY): Troponin I (High Sensitivity): 11 ng/L (ref <18)

## 2021-05-17 MED ORDER — SODIUM CHLORIDE 0.9 % IV BOLUS: 1000.0000 mL | Freq: Once | INTRAVENOUS | Status: DC
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
## 2021-05-17 NOTE — Discharge Instructions (Addendum)
Do not drive until evaluated to make sure you are safe to drive.  Please follow-up with your primary care provider regarding your symptoms as well as this visit today.  I would also recommend following up with your cardiologist. ?
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
## 2021-05-17 NOTE — ED Notes (Signed)
DC instructions reviewed with pt. PT verbalized understanding.  PT Dc.  
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
## 2021-05-23 ENCOUNTER — Encounter: Payer: Self-pay | Admitting: Cardiology

## 2021-05-23 ENCOUNTER — Ambulatory Visit (INDEPENDENT_AMBULATORY_CARE_PROVIDER_SITE_OTHER): Payer: No Typology Code available for payment source | Admitting: Cardiology

## 2021-05-23 ENCOUNTER — Ambulatory Visit (INDEPENDENT_AMBULATORY_CARE_PROVIDER_SITE_OTHER): Payer: No Typology Code available for payment source

## 2021-05-23 VITALS — BP 164/68 | HR 93 | Ht 69.0 in | Wt 151.0 lb

## 2021-05-23 DIAGNOSIS — R55 Syncope and collapse: Secondary | ICD-10-CM | POA: Diagnosis not present

## 2021-05-23 DIAGNOSIS — I739 Peripheral vascular disease, unspecified: Secondary | ICD-10-CM

## 2021-05-23 DIAGNOSIS — Z720 Tobacco use: Secondary | ICD-10-CM

## 2021-05-23 DIAGNOSIS — R0989 Other specified symptoms and signs involving the circulatory and respiratory systems: Secondary | ICD-10-CM | POA: Diagnosis not present

## 2021-05-23 DIAGNOSIS — E785 Hyperlipidemia, unspecified: Secondary | ICD-10-CM

## 2021-05-23 DIAGNOSIS — I10 Essential (primary) hypertension: Secondary | ICD-10-CM

## 2021-05-23 DIAGNOSIS — N179 Acute kidney failure, unspecified: Secondary | ICD-10-CM

## 2021-05-23 LAB — LIPID PANEL
Chol/HDL Ratio: 2.1 ratio (ref 0.0–5.0)
Cholesterol, Total: 131 mg/dL (ref 100–199)
HDL: 63 mg/dL (ref >39)
LDL Chol Calc (NIH): 34 mg/dL (ref 0–99)
Triglycerides: 225 mg/dL — ABNORMAL HIGH (ref 0–149)
VLDL Cholesterol Cal: 34 mg/dL (ref 5–40)

## 2021-05-23 LAB — BASIC METABOLIC PANEL
BUN/Creatinine Ratio: 17 (ref 9–20)
BUN: 15 mg/dL (ref 6–24)
CO2: 23 mmol/L (ref 20–29)
Calcium: 8.8 mg/dL (ref 8.7–10.2)
Chloride: 101 mmol/L (ref 96–106)
Creatinine, Ser: 0.9 mg/dL (ref 0.76–1.27)
Glucose: 81 mg/dL (ref 70–99)
Potassium: 4.9 mmol/L (ref 3.5–5.2)
Sodium: 138 mmol/L (ref 134–144)
eGFR: 100 mL/min/{1.73_m2} (ref >59)

## 2021-05-23 LAB — TSH: TSH: 1.54 u[IU]/mL (ref 0.450–4.500)

## 2021-05-23 MED ORDER — NICOTINE 7 MG/24HR TD PT24: 7.0000 mg | MEDICATED_PATCH | Freq: Every day | TRANSDERMAL | 0 refills | Status: DC

## 2021-05-23 MED ORDER — NICOTINE 14 MG/24HR TD PT24: 14.0000 mg | MEDICATED_PATCH | Freq: Every day | TRANSDERMAL | 0 refills | Status: DC

## 2021-05-23 NOTE — Patient Instructions (Addendum)
Medication Instructions:  ?START nicotine patch as directed ? ?*If you need a refill on your cardiac medications before your next appointment, please call your pharmacy* ? ? ?Lab Work: ?BMET, Lipid, TSH today ? ?If you have labs (blood work) drawn today and your tests are completely normal, you will receive your results only by: ?MyChart Message (if you have MyChart) OR ?A paper copy in the mail ?If you have any lab test that is abnormal or we need to change your treatment, we will call you to review the results. ? ? ?Testing/Procedures: ?Your physician has requested that you have a carotid duplex. This test is an ultrasound of the carotid arteries in your neck. It looks at blood flow through these arteries that supply the brain with blood. Allow one hour for this exam. There are no restrictions or special instructions. ? ?Your physician has requested that you have an ankle brachial index (ABI). During this test an ultrasound and blood pressure cuff are used to evaluate the arteries that supply the arms and legs with blood. Allow thirty minutes for this exam. There are no restrictions or special instructions. ? ?Your physician has requested that you have an echocardiogram. Echocardiography is a painless test that uses sound waves to create images of your heart. It provides your doctor with information about the size and shape of your heart and how well your heart?s chambers and valves are working. This procedure takes approximately one hour. There are no restrictions for this procedure. ? ?ZIO XT- Long Term Monitor Instructions  ? ?Your physician has requested you wear a ZIO patch monitor for _14_ days.  ?This is a single patch monitor.   [employer] supplies one patch monitor per enrollment. Additional stickers are not available. Please do not apply patch if you will be having a Nuclear Stress Test, Echocardiogram, Cardiac CT, MRI, or Chest Xray during the period you would be wearing the monitor. The patch cannot be  worn during these tests. You cannot remove and re-apply the ZIO XT patch monitor.  ?Your ZIO patch monitor will be sent Fed Ex from [employer] directly to your home address. It may take 3-5 days to receive your monitor after you have been enrolled.  ?Once you have received your monitor, please review the enclosed instructions. Your monitor has already been registered assigning a specific monitor serial # to you. ? ?Billing and Patient Assistance Program Information  ? ?We have supplied [employer] with any of your insurance information on file for billing purposes. ?[employer] offers a sliding scale Patient Assistance Program for patients that do not have insurance, or whose insurance does not completely cover the cost of the ZIO monitor.   You must apply for the Patient Assistance Program to qualify for this discounted rate.     To apply, please call [employer] at [phone], select option 4, then select option 2, and ask to apply for Patient Assistance Program.  Theodore Demark will ask your household income, and how many people are in your household.  They will quote your out-of-pocket cost based on that information.  [employer] will also be able to set up a 92-month, interest-free payment plan if needed. ? ?Applying the monitor  ? ?Shave hair from upper left chest.  ?Hold abrader disc by orange tab. Rub abrader in 40 strokes over the upper left chest as indicated in your monitor instructions.  ?Clean area with 4 enclosed alcohol pads. Let dry.  ?Apply patch as indicated in monitor instructions. Patch will be placed under  collarbone on left side of chest with arrow pointing upward.  ?Rub patch adhesive wings for 2 minutes. Remove white label marked "1". Remove the white label marked "2". Rub patch adhesive wings for 2 additional minutes.  ?While looking in a mirror, press and release button in center of patch. A small green light will flash 3-4 times. This will be your only indicator that the monitor has been turned on. ?   ?Do not shower for the first 24 hours. You may shower after the first 24 hours.  ?Press the button if you feel a symptom. You will hear a small click. Record Date, Time and Symptom in the Patient Logbook.  ?When you are ready to remove the patch, follow instructions on the last 2 pages of the Patient Logbook. Stick patch monitor onto the last page of Patient Logbook.  ?Place Patient Logbook in the blue and white box.  Use locking tab on box and tape box closed securely.  The blue and white box has prepaid postage on it. Please place it in the mailbox as soon as possible. Your physician should have your test results approximately 7 days after the monitor has been mailed back to [facility].  ?Call [facility] at [phone] if you have questions regarding your ZIO XT patch monitor. Call them immediately if you see an orange light blinking on your monitor.  ?If your monitor falls off in less than 4 days, contact our Monitor department at [phone]. ?If your monitor becomes loose or falls off after 4 days call [employer] at [phone] for suggestions on securing your monitor.? ? ?Follow-Up: ?At [facility], you and your health needs are our priority.  As part of our continuing mission to provide you with exceptional heart care, we have created designated Provider Care Teams.  These Care Teams include your primary Cardiologist (physician) and Advanced Practice Providers (APPs -  Physician Assistants and Nurse Practitioners) who all work together to provide you with the care you need, when you need it. ? ?We recommend signing up for the patient portal called "MyChart".  Sign up information is provided on this After Visit Summary.  MyChart is used to connect with patients for Virtual Visits (Telemedicine).  Patients are able to view lab/test results, encounter notes, upcoming appointments, etc.  Non-urgent messages can be sent to your provider as well.   ?To learn more about what you can do  with MyChart, go to [email or web address].   ? ?Your next appointment:   ?3 month(s) ? ?The format for your next appointment:   ?In Person ? ?Provider:   ?Dr. Gardiner Rhyme ? ?Other Instructions ?Please check your blood pressure at home twice daily, write it down.  Call the office or send message via Mychart with the readings in 1 week for Dr. Gardiner Rhyme to review.  ? ?No driving for 6 months without syncopal episode ? ?Important Information About Sugar ? ? ? ? ? ? ?

## 2021-05-23 NOTE — Progress Notes (Unsigned)
Enrolled for [employer] to mail a ZIO AT Live Telemetry monitor to patients address on file.  

## 2021-05-23 NOTE — Addendum Note (Signed)
Addended by: Johney Frame A on: 05/23/2021 10:22 AM ? ? Modules accepted: Orders ? ?

## 2021-05-25 DIAGNOSIS — R55 Syncope and collapse: Secondary | ICD-10-CM | POA: Diagnosis not present

## 2021-05-26 ENCOUNTER — Encounter: Payer: Self-pay | Admitting: *Deleted

## 2021-06-01 ENCOUNTER — Ambulatory Visit (HOSPITAL_BASED_OUTPATIENT_CLINIC_OR_DEPARTMENT_OTHER)
Admission: RE | Admit: 2021-06-01 | Discharge: 2021-06-01 | Disposition: A | Discharge status: Home / Self Care | Payer: No Typology Code available for payment source | Source: Ambulatory Visit | Attending: Cardiology | Admitting: Cardiology

## 2021-06-01 ENCOUNTER — Ambulatory Visit (HOSPITAL_BASED_OUTPATIENT_CLINIC_OR_DEPARTMENT_OTHER): Payer: No Typology Code available for payment source

## 2021-06-01 ENCOUNTER — Ambulatory Visit (HOSPITAL_COMMUNITY)
Admission: RE | Admit: 2021-06-01 | Discharge: 2021-06-01 | Disposition: A | Discharge status: Home / Self Care | Payer: No Typology Code available for payment source | Source: Ambulatory Visit | Attending: Cardiology | Admitting: Cardiology

## 2021-06-01 DIAGNOSIS — R55 Syncope and collapse: Secondary | ICD-10-CM | POA: Diagnosis not present

## 2021-06-01 DIAGNOSIS — R0989 Other specified symptoms and signs involving the circulatory and respiratory systems: Secondary | ICD-10-CM | POA: Insufficient documentation

## 2021-06-01 DIAGNOSIS — I739 Peripheral vascular disease, unspecified: Secondary | ICD-10-CM | POA: Diagnosis present

## 2021-06-01 LAB — ECHOCARDIOGRAM COMPLETE
Area-P 1/2: 4.35 cm2
S' Lateral: 1.9 cm

## 2021-06-03 ENCOUNTER — Telehealth: Payer: Self-pay | Admitting: Cardiology

## 2021-06-03 NOTE — Telephone Encounter (Signed)
New Message: ? ? ?Jasmine called and said had try to reach the patient, but was unable to reach him. She wanted him  to know they will not pay for his device. His out of pocket will be $695. ?

## 2021-06-04 ENCOUNTER — Telehealth: Payer: Self-pay | Admitting: Student

## 2021-06-04 NOTE — Telephone Encounter (Signed)
? ?  Cardiac Monitor Alert ? ?Date of alert:  06/04/2021  ? ?Patient Name: Connor Hunt  ?DOB: 10-05-1965  ?MRN: [id number]  ? ?[facility] Cardiologist: Little Ishikawa, MD  ? ?Monitor Information: ?Long Term Monitor-Live Telemetry [ZioAT]  ?Reason: Syncope ?Ordering provider:  Dr. Bjorn Pippin ? ?Supraventricular Tachycardia - fastest HR:  190's ?This is the 1st alert for this rhythm.  ? ?Received a notification from [employer] that the patient went into SVT this morning with heart rate in the 190's. His return rhythm was sinus tachycardia. They contacted the patient and he was asymptomatic with the episode. I have asked for strips to be faxed to the office for review. Will route to Dr. Bjorn Pippin as an [place]ain Childes.  ? ?Signed, ?Ellsworth Lennox, PA-C  ?06/04/2021 12:04 PM  ? ?

## 2021-06-06 ENCOUNTER — Other Ambulatory Visit: Payer: Self-pay | Admitting: *Deleted

## 2021-06-06 DIAGNOSIS — I739 Peripheral vascular disease, unspecified: Secondary | ICD-10-CM

## 2021-06-16 ENCOUNTER — Other Ambulatory Visit: Payer: Self-pay

## 2021-06-16 ENCOUNTER — Encounter: Payer: Self-pay | Admitting: Cardiovascular Disease

## 2021-06-16 ENCOUNTER — Ambulatory Visit (INDEPENDENT_AMBULATORY_CARE_PROVIDER_SITE_OTHER): Payer: No Typology Code available for payment source | Admitting: Cardiovascular Disease

## 2021-06-16 DIAGNOSIS — I739 Peripheral vascular disease, unspecified: Secondary | ICD-10-CM | POA: Diagnosis not present

## 2021-06-16 LAB — CBC
Hematocrit: 38.3 % (ref 37.5–51.0)
Hemoglobin: 12.7 g/dL — ABNORMAL LOW (ref 13.0–17.7)
MCH: 33.5 pg — ABNORMAL HIGH (ref 26.6–33.0)
MCHC: 33.2 g/dL (ref 31.5–35.7)
MCV: 101 fL — ABNORMAL HIGH (ref 79–97)
Platelets: 305 10*3/uL (ref 150–450)
RBC: 3.79 x10E6/uL — ABNORMAL LOW (ref 4.14–5.80)
RDW: 13.9 % (ref 11.6–15.4)
WBC: 7.2 10*3/uL (ref 3.4–10.8)

## 2021-06-16 LAB — BASIC METABOLIC PANEL
BUN/Creatinine Ratio: 18 (ref 9–20)
BUN: 16 mg/dL (ref 6–24)
CO2: 19 mmol/L — ABNORMAL LOW (ref 20–29)
Calcium: 9 mg/dL (ref 8.7–10.2)
Chloride: 101 mmol/L (ref 96–106)
Creatinine, Ser: 0.87 mg/dL (ref 0.76–1.27)
Glucose: 86 mg/dL (ref 70–99)
Potassium: 5.1 mmol/L (ref 3.5–5.2)
Sodium: 135 mmol/L (ref 134–144)
eGFR: 101 mL/min/{1.73_m2} (ref >59)

## 2021-06-16 MED ORDER — SODIUM CHLORIDE 0.9% FLUSH: 3.0000 mL | Freq: Two times a day (BID) | INTRAVENOUS | Status: DC

## 2021-06-16 NOTE — Progress Notes (Signed)
06/16/2021 [name]   Aug 09, 1965  [id number]  Primary Physician Kathie Dike, FNP Primary Cardiologist: Runell Gess MD Nicholes Calamity, [place]  HPI:  Simone Tuckey is a 56 y.o. thin-appearing married Caucasian male father of 2, grandfather of 5 grandchildren who was referred by Dr. Bjorn Pippin , his cardiologist, for evaluation of symptomatic PAD.  He has a history of 30-pack-year tobacco abuse, treated hypertension and hyperlipidemia.  There is no family history for heart disease.  Never had a heart attack or stroke.  He denies chest pain or shortness of breath.  He has had right lower extremity lifestyle-limiting claudication for several years.  Recent Doppler studies performed 06/01/2021 revealed a right ABI of 0.51 and left of 0.70 with occluded SFAs bilaterally.  He also has moderate right ICA stenosis.  He was prescribed cilostazol twice daily over the last 3 months with little benefit.  He wishes to proceed with endovascular therapy.   Current Meds  Medication Sig   amLODipine (NORVASC) 10 MG tablet Take 10 mg by mouth daily.   aspirin EC 81 MG tablet Take 81 mg by mouth daily. Swallow whole.   atorvastatin (LIPITOR) 20 MG tablet Take 20 mg by mouth daily.   cilostazol (PLETAL) 50 MG tablet Take 50 mg by mouth 2 (two) times daily.   lisinopril (ZESTRIL) 40 MG tablet Take 40 mg by mouth daily.     No Known Allergies  Social History   Socioeconomic History   Marital status: Married    Spouse name: Not on file   Number of children: Not on file   Years of education: Not on file   Highest education level: Not on file  Occupational History   Not on file  Tobacco Use   Smoking status: Every Day    Packs/day: 1.00    Types: Cigarettes   Smokeless tobacco: Not on file  Substance and Sexual Activity   Alcohol use: Yes    Alcohol/week: 21.0 standard drinks    Types: 21 Cans of beer per week   Drug use: No   Sexual activity: Not on file  Other Topics Concern    Not on file  Social History Narrative   Not on file   Social Determinants of Health   Financial Resource Strain: Not on file  Food Insecurity: Not on file  Transportation Needs: Not on file  Physical Activity: Not on file  Stress: Not on file  Social Connections: Not on file  Intimate Partner Violence: Not on file     Review of Systems: General: negative for chills, fever, night sweats or weight changes.  Cardiovascular: negative for chest pain, dyspnea on exertion, edema, orthopnea, palpitations, paroxysmal nocturnal dyspnea or shortness of breath Dermatological: negative for rash Respiratory: negative for cough or wheezing Urologic: negative for hematuria Abdominal: negative for nausea, vomiting, diarrhea, bright red blood per rectum, melena, or hematemesis Neurologic: negative for visual changes, syncope, or dizziness All other systems reviewed and are otherwise negative except as noted above.    Blood pressure (!) 166/80, pulse 95, height 5\' 9"  (1.753 m), weight 148 lb 9.6 oz (67.4 kg), SpO2 99 %.  General appearance: alert and no distress Neck: no adenopathy, no JVD, supple, symmetrical, trachea midline, thyroid not enlarged, symmetric, no tenderness/mass/nodules, and soft bilateral carotid bruits right lateral the left Lungs: clear to auscultation bilaterally Heart: regular rate and rhythm, S1, S2 normal, no murmur, click, rub or gallop Extremities: extremities normal, atraumatic, no cyanosis or edema  Pulses: Absent pedal pulses Skin: Skin color, texture, turgor normal. No rashes or lesions Neurologic: Grossly normal  EKG not performed today  ASSESSMENT AND PLAN:   Peripheral arterial disease [facility]) [name] was referred to me by Dr. Bjorn Pippin for evaluation of symptomatic PAD.  He has multiple risk factors including tobacco abuse, hypertension and hyperlipidemia.  He also has carotid artery disease by duplex ultrasound.  He has had right lower extremity  lifestyle-limiting claudication for several years.  Recent Doppler studies performed 06/01/2021 revealed a right ABI of 0.51 and a left of 0.70 with occluded SFAs bilaterally.  He has failed conservative therapy with cilostazol for the last 3 months and wishes to proceed with endovascular therapy.     Runell Gess MD FACP,FACC,FAHA, [facility] 06/16/2021 8:58 AM

## 2021-06-16 NOTE — Assessment & Plan Note (Signed)
Mr. Connor Hunt was referred to me by Dr. Gardiner Rhyme for evaluation of symptomatic PAD.  He has multiple risk factors including tobacco abuse, hypertension and hyperlipidemia.  He also has carotid artery disease by duplex ultrasound.  He has had right lower extremity lifestyle-limiting claudication for several years.  Recent Doppler studies performed 06/01/2021 revealed a right ABI of 0.51 and a left of 0.70 with occluded SFAs bilaterally.  He has failed conservative therapy with cilostazol for the last 3 months and wishes to proceed with endovascular therapy.

## 2021-06-16 NOTE — Patient Instructions (Signed)
Medication Instructions:  Your physician recommends that you continue on your current medications as directed. Please refer to the Current Medication list given to you today.  *If you need a refill on your cardiac medications before your next appointment, please call your pharmacy*   Lab Work: Your physician recommends that you have labs drawn today: BMET & CBC  If you have labs (blood work) drawn today and your tests are completely normal, you will receive your results only by: MyChart Message (if you have MyChart) OR A paper copy in the mail If you have any lab test that is abnormal or we need to change your treatment, we will call you to review the results.   Testing/Procedures: Your physician has requested that you have a lower extremity arterial duplex. This test is an ultrasound of the arteries in the legs. It looks at arterial blood flow in the legs. Allow one hour for Lower Arterial scans. There are no restrictions or special instructions  Your physician has requested that you have an ankle brachial index (ABI). During this test an ultrasound and blood pressure cuff are used to evaluate the arteries that supply the arms and legs with blood. Allow thirty minutes for this exam. There are no restrictions or special instructions. To be done 1-2 weeks after PV procedure (6/1). These will be done at [address] [facility]   Dr. Allyson Sabal has ordered a CT coronary calcium score.   Test locations:  [facility] ([address] 3rd Floor [place], [place] [address]) [facility] ([address] [place], [place] [address])   This is $99 out of pocket.   Coronary CalciumScan A coronary calcium scan is an imaging test used to look for deposits of calcium and other fatty materials (plaques) in the inner lining of the blood vessels of the heart (coronary arteries). These deposits of calcium and plaques can partly clog and narrow the coronary arteries without producing any symptoms or  warning signs. This puts a person at risk for a heart attack. This test can detect these deposits before symptoms develop. Tell a health care provider about: Any allergies you have. All medicines you are taking, including vitamins, herbs, eye drops, creams, and over-the-counter medicines. Any problems you or family members have had with anesthetic medicines. Any blood disorders you have. Any surgeries you have had. Any medical conditions you have. Whether you are pregnant or may be pregnant. What are the risks? Generally, this is a safe procedure. However, problems may occur, including: Harm to a pregnant woman and her unborn baby. This test involves the use of radiation. Radiation exposure can be dangerous to a pregnant woman and her unborn baby. If you are pregnant, you generally should not have this procedure done. Slight increase in the risk of cancer. This is because of the radiation involved in the test. What happens before the procedure? No preparation is needed for this procedure. What happens during the procedure? You will undress and remove any jewelry around your neck or chest. You will put on a hospital gown. Sticky electrodes will be placed on your chest. The electrodes will be connected to an electrocardiogram (ECG) machine to record a tracing of the electrical activity of your heart. A CT scanner will take pictures of your heart. During this time, you will be asked to lie still and hold your breath for 2-3 seconds while a picture of your heart is being taken. The procedure may vary among health care providers and hospitals. What happens after  the procedure? You can get dressed. You can return to your normal activities. It is up to you to get the results of your test. Ask your health care provider, or the department that is doing the test, when your results will be ready. Summary A coronary calcium scan is an imaging test used to look for deposits of calcium and other fatty  materials (plaques) in the inner lining of the blood vessels of the heart (coronary arteries). Generally, this is a safe procedure. Tell your health care provider if you are pregnant or may be pregnant. No preparation is needed for this procedure. A CT scanner will take pictures of your heart. You can return to your normal activities after the scan is done. This information is not intended to replace advice given to you by your health care provider. Make sure you discuss any questions you have with your health care provider. Document Released: 07/08/2007 Document Revised: 11/29/2015 Document Reviewed: 11/29/2015 Elsevier Interactive Patient Education  2017 [employer].    Follow-Up: At [facility] you and your health needs are our priority.  As part of our continuing mission to provide you with exceptional heart care, we have created designated Provider Care Teams.  These Care Teams include your primary Cardiologist (physician) and Advanced Practice Providers (APPs -  Physician Assistants and Nurse Practitioners) who all work together to provide you with the care you need, when you need it.  We recommend signing up for the patient portal called "MyChart".  Sign up information is provided on this After Visit Summary.  MyChart is used to connect with patients for Virtual Visits (Telemedicine).  Patients are able to view lab/test results, encounter notes, upcoming appointments, etc.  Non-urgent messages can be sent to your provider as well.   To learn more about what you can do with MyChart, go to [email or web address].    Your next appointment:   2 week(s)  The format for your next appointment:   In Person  Provider:   Nanetta Batty, MD   Other Instructions  [facility] GROUP [facility] CARDIOVASCULAR DIVISION [facility] [address] [place] [address] [place] [address] Dept: [phone] Loc: [phone]  Trust Leh  06/16/2021  You are scheduled  for a Peripheral Angiogram on Thursday, June 1 with Dr. Nanetta Batty.  1. Please arrive at the Main Entrance A at [facility]: [address] [place], [place] [address] at 5:30 AM (This time is two hours before your procedure to ensure your preparation). Free valet parking service is available.   Special note: Every effort is made to have your procedure done on time. Please understand that emergencies sometimes delay scheduled procedures.  2. Diet: Do not eat solid foods after midnight.  You may have clear liquids until 5 AM upon the day of the procedure.  3. Labs: You will need to have blood drawn today.  4. Medication instructions in preparation for your procedure:    On the morning of your procedure, take Aspirin and any morning medicines NOT listed above.  You may use sips of water.  5. Plan to go home the same day, you will only stay overnight if medically necessary. 6. You MUST have a responsible adult to drive you home. 7. An adult MUST be with you the first 24 hours after you arrive home. 8. Bring a current list of your medications, and the last time and date medication taken. 9. Bring ID and current insurance cards. 10.Please wear clothes that are  easy to get on and off and wear slip-on shoes.  Thank you for allowing [place] to care for you!   -- [place] [facility]

## 2021-06-16 NOTE — H&P (View-Only) (Signed)
06/16/2021 Connor Hunt   Aug 09, 1965  [id number]  Primary Physician Kathie Dike, FNP Primary Cardiologist: Runell Gess MD Connor Hunt, [place]  HPI:  Connor Hunt is a 56 y.o. thin-appearing married Caucasian male father of 2, grandfather of 5 grandchildren who was referred by Dr. Bjorn Hunt , his cardiologist, for evaluation of symptomatic PAD.  He has a history of 30-pack-year tobacco abuse, treated hypertension and hyperlipidemia.  There is no family history for heart disease.  Never had a heart attack or stroke.  He denies chest pain or shortness of breath.  He has had right lower extremity lifestyle-limiting claudication for several years.  Recent Doppler studies performed 06/01/2021 revealed a right ABI of 0.51 and left of 0.70 with occluded SFAs bilaterally.  He also has moderate right ICA stenosis.  He was prescribed cilostazol twice daily over the last 3 months with little benefit.  He wishes to proceed with endovascular therapy.   Current Meds  Medication Sig   amLODipine (NORVASC) 10 MG tablet Take 10 mg by mouth daily.   aspirin EC 81 MG tablet Take 81 mg by mouth daily. Swallow whole.   atorvastatin (LIPITOR) 20 MG tablet Take 20 mg by mouth daily.   cilostazol (PLETAL) 50 MG tablet Take 50 mg by mouth 2 (two) times daily.   lisinopril (ZESTRIL) 40 MG tablet Take 40 mg by mouth daily.     No Known Allergies  Social History   Socioeconomic History   Marital status: Married    Spouse name: Not on file   Number of children: Not on file   Years of education: Not on file   Highest education level: Not on file  Occupational History   Not on file  Tobacco Use   Smoking status: Every Day    Packs/day: 1.00    Types: Cigarettes   Smokeless tobacco: Not on file  Substance and Sexual Activity   Alcohol use: Yes    Alcohol/week: 21.0 standard drinks    Types: 21 Cans of beer per week   Drug use: No   Sexual activity: Not on file  Other Topics Concern    Not on file  Social History Narrative   Not on file   Social Determinants of Health   Financial Resource Strain: Not on file  Food Insecurity: Not on file  Transportation Needs: Not on file  Physical Activity: Not on file  Stress: Not on file  Social Connections: Not on file  Intimate Partner Violence: Not on file     Review of Systems: General: negative for chills, fever, night sweats or weight changes.  Cardiovascular: negative for chest pain, dyspnea on exertion, edema, orthopnea, palpitations, paroxysmal nocturnal dyspnea or shortness of breath Dermatological: negative for rash Respiratory: negative for cough or wheezing Urologic: negative for hematuria Abdominal: negative for nausea, vomiting, diarrhea, bright red blood per rectum, melena, or hematemesis Neurologic: negative for visual changes, syncope, or dizziness All other systems reviewed and are otherwise negative except as noted above.    Blood pressure (!) 166/80, pulse 95, height 5\' 9"  (1.753 m), weight 148 lb 9.6 oz (67.4 kg), SpO2 99 %.  General appearance: alert and no distress Neck: no adenopathy, no JVD, supple, symmetrical, trachea midline, thyroid not enlarged, symmetric, no tenderness/mass/nodules, and soft bilateral carotid bruits right lateral the left Lungs: clear to auscultation bilaterally Heart: regular rate and rhythm, S1, S2 normal, no murmur, click, rub or gallop Extremities: extremities normal, atraumatic, no cyanosis or edema  Pulses: Absent pedal pulses Skin: Skin color, texture, turgor normal. No rashes or lesions Neurologic: Grossly normal  EKG not performed today  ASSESSMENT AND PLAN:   Peripheral arterial disease Connor Hunt) Mr. Connor Hunt was referred to me by Dr. Bjorn Hunt for evaluation of symptomatic PAD.  He has multiple risk factors including tobacco abuse, hypertension and hyperlipidemia.  He also has carotid artery disease by duplex ultrasound.  He has had right lower extremity  lifestyle-limiting claudication for several years.  Recent Doppler studies performed 06/01/2021 revealed a right ABI of 0.51 and a left of 0.70 with occluded SFAs bilaterally.  He has failed conservative therapy with cilostazol for the last 3 months and wishes to proceed with endovascular therapy.     Runell Gess MD FACP,FACC,FAHA, [facility] 06/16/2021 8:58 AM

## 2021-06-17 ENCOUNTER — Telehealth: Payer: Self-pay

## 2021-06-17 NOTE — Telephone Encounter (Signed)
Spoke with pt regarding change in date for his PV angiogram. Pt rescheduled for 6/19 at 7:30am. Advised pt that follow up dopplers and office visit would need to be rescheduled. Pt verbalizes understanding.

## 2021-06-21 ENCOUNTER — Other Ambulatory Visit: Payer: Self-pay | Admitting: *Deleted

## 2021-06-21 MED ORDER — METOPROLOL TARTRATE 25 MG PO TABS: 25.0000 mg | ORAL_TABLET | Freq: Two times a day (BID) | ORAL | 3 refills | Status: DC

## 2021-06-27 ENCOUNTER — Ambulatory Visit (INDEPENDENT_AMBULATORY_CARE_PROVIDER_SITE_OTHER)
Admission: RE | Admit: 2021-06-27 | Discharge: 2021-06-27 | Disposition: A | Discharge status: Home / Self Care | Payer: Self-pay | Source: Ambulatory Visit | Attending: Cardiovascular Disease | Admitting: Cardiovascular Disease

## 2021-06-27 DIAGNOSIS — I739 Peripheral vascular disease, unspecified: Secondary | ICD-10-CM

## 2021-07-07 ENCOUNTER — Telehealth: Payer: Self-pay | Admitting: Cardiovascular Disease

## 2021-07-07 ENCOUNTER — Telehealth: Payer: Self-pay | Admitting: *Deleted

## 2021-07-07 NOTE — Telephone Encounter (Signed)
Follow up:.      Patient's wife is returning  Sherri's call from today.

## 2021-07-07 NOTE — Telephone Encounter (Signed)
Pt's wife would like a call back regarding pt's appt that is scheduled for 6/22. Please advise

## 2021-07-07 NOTE — Telephone Encounter (Signed)
LMTCB

## 2021-07-07 NOTE — Telephone Encounter (Signed)
Left message to call back  

## 2021-07-07 NOTE — Telephone Encounter (Signed)
Cardiac Catheterization scheduled at [facility] for: Monday July 11, 2021 7:30 AM Arrival time and place: [facility] at: 5:30 AM   Nothing to eat after midnight prior to procedure, clear liquids until 5 AM day of procedure.  Medication instructions: -Usual morning medications can be taken with sips of water including aspirin 81 mg.  Confirmed patient has responsible adult to drive home post procedure and be with patient first 24 hours after arriving home.  Patient reports no new symptoms concerning for COVID-19/no exposure to COVID-19 in the past 10 days.  Reviewed procedure instructions with patient.

## 2021-07-07 NOTE — Telephone Encounter (Signed)
-  Wife called requesting to have follow up appointment scheduled with Dr. Allyson Sabal as pt is having a PV procedure. -Appointment rescheduled for 7/14

## 2021-07-07 NOTE — Telephone Encounter (Signed)
Patient's wife returned call and rescheduled patient for 7/25 with Edd Fabian, NP, but states she still has concerns and would like to speak with a nurse regarding scheduling a procedure for patient's other leg.

## 2021-07-07 NOTE — Telephone Encounter (Signed)
Patient is returning phone call.  °

## 2021-07-11 ENCOUNTER — Encounter (HOSPITAL_COMMUNITY)
Admission: RE | Disposition: A | Discharge status: Home / Self Care | Payer: Self-pay | Source: Home / Self Care | Attending: Cardiovascular Disease

## 2021-07-11 ENCOUNTER — Encounter (HOSPITAL_COMMUNITY): Payer: No Typology Code available for payment source

## 2021-07-11 ENCOUNTER — Ambulatory Visit (HOSPITAL_COMMUNITY)
Admission: RE | Admit: 2021-07-11 | Discharge: 2021-07-12 | Disposition: A | Discharge status: Home / Self Care | Payer: PRIVATE HEALTH INSURANCE | Attending: Cardiovascular Disease | Admitting: Cardiovascular Disease

## 2021-07-11 ENCOUNTER — Other Ambulatory Visit: Payer: Self-pay

## 2021-07-11 ENCOUNTER — Encounter (HOSPITAL_COMMUNITY): Payer: Self-pay | Admitting: Cardiovascular Disease

## 2021-07-11 DIAGNOSIS — I70211 Atherosclerosis of native arteries of extremities with intermittent claudication, right leg: Secondary | ICD-10-CM

## 2021-07-11 DIAGNOSIS — I739 Peripheral vascular disease, unspecified: Secondary | ICD-10-CM | POA: Diagnosis present

## 2021-07-11 DIAGNOSIS — F17210 Nicotine dependence, cigarettes, uncomplicated: Secondary | ICD-10-CM | POA: Insufficient documentation

## 2021-07-11 DIAGNOSIS — I10 Essential (primary) hypertension: Secondary | ICD-10-CM | POA: Diagnosis not present

## 2021-07-11 DIAGNOSIS — E785 Hyperlipidemia, unspecified: Secondary | ICD-10-CM | POA: Insufficient documentation

## 2021-07-11 DIAGNOSIS — F17200 Nicotine dependence, unspecified, uncomplicated: Secondary | ICD-10-CM | POA: Diagnosis present

## 2021-07-11 HISTORY — PX: PERIPHERAL VASCULAR BALLOON ANGIOPLASTY: CATH118281

## 2021-07-11 HISTORY — PX: PERIPHERAL VASCULAR ATHERECTOMY: CATH118256

## 2021-07-11 HISTORY — PX: ABDOMINAL AORTOGRAM W/LOWER EXTREMITY: CATH118223

## 2021-07-11 LAB — POCT ACTIVATED CLOTTING TIME
Activated Clotting Time: 173 seconds
Activated Clotting Time: 197 seconds

## 2021-07-11 SURGERY — ABDOMINAL AORTOGRAM W/LOWER EXTREMITY
Anesthesia: LOCAL | Laterality: Bilateral

## 2021-07-11 MED ORDER — ATORVASTATIN CALCIUM 10 MG PO TABS: 20.0000 mg | ORAL_TABLET | Freq: Every day | ORAL | Status: DC

## 2021-07-11 MED ORDER — ASPIRIN 81 MG PO TBEC: 81.0000 mg | DELAYED_RELEASE_TABLET | Freq: Every day | ORAL | Status: DC

## 2021-07-11 MED ORDER — HEPARIN SODIUM (PORCINE) 1000 UNIT/ML IJ SOLN
INTRAMUSCULAR | Status: AC
  Filled 2021-07-11: qty 10

## 2021-07-11 MED ORDER — CLOPIDOGREL BISULFATE 75 MG PO TABS
75.0000 mg | ORAL_TABLET | Freq: Every day | ORAL | Status: DC
  Administered 2021-07-12: 75 mg via ORAL
  Filled 2021-07-11: qty 1

## 2021-07-11 MED ORDER — ASPIRIN 81 MG PO CHEW: 81.0000 mg | CHEWABLE_TABLET | ORAL | Status: DC

## 2021-07-11 MED ORDER — SODIUM CHLORIDE 0.9% FLUSH: 3.0000 mL | INTRAVENOUS | Status: DC | PRN

## 2021-07-11 MED ORDER — HEPARIN (PORCINE) IN NACL 1000-0.9 UT/500ML-% IV SOLN
INTRAVENOUS | Status: AC
  Filled 2021-07-11: qty 1000

## 2021-07-11 MED ORDER — LABETALOL HCL 5 MG/ML IV SOLN: 10.0000 mg | INTRAVENOUS | Status: DC | PRN

## 2021-07-11 MED ORDER — SODIUM CHLORIDE 0.9 % IV SOLN: INTRAVENOUS | Status: AC

## 2021-07-11 MED ORDER — SODIUM CHLORIDE 0.9 % WEIGHT BASED INFUSION
1.0000 mL/kg/h | INTRAVENOUS | Status: DC
  Administered 2021-07-11: 250 mL via INTRAVENOUS

## 2021-07-11 MED ORDER — ASPIRIN 81 MG PO TBEC
81.0000 mg | DELAYED_RELEASE_TABLET | Freq: Every day | ORAL | Status: DC
  Administered 2021-07-12: 81 mg via ORAL
  Filled 2021-07-11: qty 1

## 2021-07-11 MED ORDER — SODIUM CHLORIDE 0.9 % IV SOLN: 250.0000 mL | INTRAVENOUS | Status: DC | PRN

## 2021-07-11 MED ORDER — SODIUM CHLORIDE 0.9 % WEIGHT BASED INFUSION
3.0000 mL/kg/h | INTRAVENOUS | Status: DC
  Administered 2021-07-11: 3 mL/kg/h via INTRAVENOUS

## 2021-07-11 MED ORDER — NITROGLYCERIN 1 MG/10 ML FOR IR/CATH LAB
INTRA_ARTERIAL | Status: AC
  Filled 2021-07-11: qty 10

## 2021-07-11 MED ORDER — CLOPIDOGREL BISULFATE 300 MG PO TABS
ORAL_TABLET | ORAL | Status: AC
  Filled 2021-07-11: qty 2

## 2021-07-11 MED ORDER — LIDOCAINE HCL (PF) 1 % IJ SOLN
INTRAMUSCULAR | Status: AC
  Filled 2021-07-11: qty 30

## 2021-07-11 MED ORDER — FENTANYL CITRATE (PF) 100 MCG/2ML IJ SOLN
INTRAMUSCULAR | Status: DC | PRN
Start: 2021-07-11 — End: 2021-07-11
  Administered 2021-07-11: 25 ug via INTRAVENOUS

## 2021-07-11 MED ORDER — LIDOCAINE HCL (PF) 1 % IJ SOLN
INTRAMUSCULAR | Status: DC | PRN
  Administered 2021-07-11: 25 mL

## 2021-07-11 MED ORDER — AMLODIPINE BESYLATE 10 MG PO TABS
10.0000 mg | ORAL_TABLET | Freq: Every day | ORAL | Status: DC
  Administered 2021-07-12: 10 mg via ORAL
  Filled 2021-07-11: qty 1

## 2021-07-11 MED ORDER — MORPHINE SULFATE (PF) 2 MG/ML IV SOLN: 2.0000 mg | INTRAVENOUS | Status: DC | PRN

## 2021-07-11 MED ORDER — ACETAMINOPHEN 325 MG PO TABS: 650.0000 mg | ORAL_TABLET | ORAL | Status: DC | PRN

## 2021-07-11 MED ORDER — SODIUM CHLORIDE 0.9% FLUSH
3.0000 mL | Freq: Two times a day (BID) | INTRAVENOUS | Status: DC
  Administered 2021-07-12: 3 mL via INTRAVENOUS

## 2021-07-11 MED ORDER — ONDANSETRON HCL 4 MG/2ML IJ SOLN: 4.0000 mg | Freq: Four times a day (QID) | INTRAMUSCULAR | Status: DC | PRN

## 2021-07-11 MED ORDER — HEPARIN SODIUM (PORCINE) 1000 UNIT/ML IJ SOLN
INTRAMUSCULAR | Status: DC | PRN
  Administered 2021-07-11: 7500 [IU] via INTRAVENOUS
  Administered 2021-07-11: 5000 [IU] via INTRAVENOUS
  Administered 2021-07-11: 3000 [IU] via INTRAVENOUS

## 2021-07-11 MED ORDER — FENTANYL CITRATE (PF) 100 MCG/2ML IJ SOLN
INTRAMUSCULAR | Status: AC
  Filled 2021-07-11: qty 2

## 2021-07-11 MED ORDER — ATORVASTATIN CALCIUM 80 MG PO TABS
80.0000 mg | ORAL_TABLET | Freq: Every day | ORAL | Status: DC
  Administered 2021-07-12: 80 mg via ORAL
  Filled 2021-07-11: qty 1

## 2021-07-11 MED ORDER — HEPARIN (PORCINE) IN NACL 1000-0.9 UT/500ML-% IV SOLN
INTRAVENOUS | Status: DC | PRN
  Administered 2021-07-11 (×2): 500 mL

## 2021-07-11 MED ORDER — CLOPIDOGREL BISULFATE 300 MG PO TABS
ORAL_TABLET | ORAL | Status: DC | PRN
  Administered 2021-07-11: 600 mg via ORAL

## 2021-07-11 MED ORDER — LISINOPRIL 20 MG PO TABS
40.0000 mg | ORAL_TABLET | Freq: Every day | ORAL | Status: DC
  Administered 2021-07-12: 40 mg via ORAL
  Filled 2021-07-11: qty 2

## 2021-07-11 MED ORDER — HYDRALAZINE HCL 20 MG/ML IJ SOLN: 5.0000 mg | INTRAMUSCULAR | Status: DC | PRN

## 2021-07-11 MED ORDER — METOPROLOL TARTRATE 25 MG PO TABS
25.0000 mg | ORAL_TABLET | Freq: Two times a day (BID) | ORAL | Status: DC
  Administered 2021-07-11 – 2021-07-12 (×2): 25 mg via ORAL
  Filled 2021-07-11 (×2): qty 1

## 2021-07-11 SURGICAL SUPPLY — 33 items
BAG SNAP BAND KOVER 36X36 (MISCELLANEOUS) ×1 IMPLANT
BALLN ADMIRAL INPACT 5X250 (BALLOONS) ×3
BALLN CHOCOLATE 3.5X80X135 (BALLOONS) ×3
BALLN COYOTE OTW 2.5X150X150 (BALLOONS) ×3
BALLN IN.PACT DCB 5X80 (BALLOONS) ×3
BALLOON ADMIRAL INPACT 5X250 (BALLOONS) IMPLANT
BALLOON CHOCOLATE 3.5X80X135 (BALLOONS) IMPLANT
BALLOON COYOTE OTW 2.5X150X150 (BALLOONS) IMPLANT
CATH ANGIO 5F PIGTAIL 65CM (CATHETERS) ×1 IMPLANT
CATH CROSS OVER TEMPO 5F (CATHETERS) ×1 IMPLANT
CATH CXI 2.3F 150 ST (CATHETERS) ×1 IMPLANT
CATH HAWKONE LX EXTENDED TIP (CATHETERS) ×1 IMPLANT
CATH VIANCE CROSS STAND 150CM (MICROCATHETER) ×3
CATH VIANCE CROSS STD 150CM (MICROCATHETER) IMPLANT
DCB IN.PACT 5X80 (BALLOONS) IMPLANT
DEVICE SPIDERFX EMB PROT 5MM (WIRE) ×1 IMPLANT
GUIDEWIRE ANGLED .035X150CM (WIRE) ×1 IMPLANT
GUIDEWIRE ZILIENT 6G 014 (WIRE) ×2 IMPLANT
KIT PV (KITS) ×4 IMPLANT
SHEATH HIGHFLEX ANSEL 7FR 55CM (SHEATH) ×1 IMPLANT
SHEATH PINNACLE 5F 10CM (SHEATH) ×1 IMPLANT
SHEATH PINNACLE 7F 10CM (SHEATH) ×1 IMPLANT
STENT ABSOLUTE PRO 6X80X135 (Permanent Stent) ×1 IMPLANT
STOPCOCK MORSE 400PSI 3WAY (MISCELLANEOUS) ×1 IMPLANT
SYR CONTROL 10ML ANGIOGRAPHIC (SYRINGE) ×1 IMPLANT
SYR MEDRAD MARK 7 150ML (SYRINGE) ×4 IMPLANT
TAPE SHOOT N SEE (TAPE) ×1 IMPLANT
TRANSDUCER W/STOPCOCK (MISCELLANEOUS) ×4 IMPLANT
TRAY PV CATH (CUSTOM PROCEDURE TRAY) ×4 IMPLANT
TUBING CIL FLEX 10 FLL-RA (TUBING) ×1 IMPLANT
VALVE MANIFOLD 3 PORT W/RA/ON (MISCELLANEOUS) ×1 IMPLANT
WIRE HITORQ VERSACORE ST 145CM (WIRE) ×1 IMPLANT
WIRE ROSEN-J .035X260CM (WIRE) ×1 IMPLANT

## 2021-07-11 NOTE — Progress Notes (Signed)
Site area: LT GROIN Site Prior to Removal:  Level 0 Pressure Applied For: 20 MINUTES Manual:  YES  Patient Status During Pull:  AWAKE Post Pull Site:  Level  0 Post Pull Instructions Given:  YES Post Pull Pulses Present: DOPPLE Dressing Applied:  YES Bedrest begins @ 12;45 Comments:

## 2021-07-11 NOTE — Interval H&P Note (Signed)
History and Physical Interval Note:  07/11/2021 7:47 AM  Connor Hunt  has presented today for surgery, with the diagnosis of PAD.  The various methods of treatment have been discussed with the patient and family. After consideration of risks, benefits and other options for treatment, the patient has consented to  Procedure(s): ABDOMINAL AORTOGRAM W/LOWER EXTREMITY (N/A) as a surgical intervention.  The patient's history has been reviewed, patient examined, no change in status, stable for surgery.  I have reviewed the patient's chart and labs.  Questions were answered to the patient's satisfaction.     Nanetta Batty

## 2021-07-12 DIAGNOSIS — I70211 Atherosclerosis of native arteries of extremities with intermittent claudication, right leg: Secondary | ICD-10-CM | POA: Diagnosis not present

## 2021-07-12 DIAGNOSIS — I739 Peripheral vascular disease, unspecified: Secondary | ICD-10-CM

## 2021-07-12 LAB — LIPID PANEL
Cholesterol: 98 mg/dL (ref 0–200)
HDL: 32 mg/dL — ABNORMAL LOW (ref >40)
LDL Cholesterol: 16 mg/dL (ref 0–99)
Total CHOL/HDL Ratio: 3.1 RATIO
Triglycerides: 251 mg/dL — ABNORMAL HIGH (ref <150)
VLDL: 50 mg/dL — ABNORMAL HIGH (ref 0–40)

## 2021-07-12 LAB — BASIC METABOLIC PANEL
Anion gap: 9 (ref 5–15)
BUN: 12 mg/dL (ref 6–20)
CO2: 18 mmol/L — ABNORMAL LOW (ref 22–32)
Calcium: 8.4 mg/dL — ABNORMAL LOW (ref 8.9–10.3)
Chloride: 110 mmol/L (ref 98–111)
Creatinine, Ser: 0.95 mg/dL (ref 0.61–1.24)
GFR, Estimated: >60 mL/min (ref >60)
Glucose, Bld: 98 mg/dL (ref 70–99)
Potassium: 4 mmol/L (ref 3.5–5.1)
Sodium: 137 mmol/L (ref 135–145)

## 2021-07-12 LAB — CBC
HCT: 34.3 % — ABNORMAL LOW (ref 39.0–52.0)
Hemoglobin: 11.4 g/dL — ABNORMAL LOW (ref 13.0–17.0)
MCH: 33.6 pg (ref 26.0–34.0)
MCHC: 33.2 g/dL (ref 30.0–36.0)
MCV: 101.2 fL — ABNORMAL HIGH (ref 80.0–100.0)
Platelets: 202 10*3/uL (ref 150–400)
RBC: 3.39 MIL/uL — ABNORMAL LOW (ref 4.22–5.81)
RDW: 14.5 % (ref 11.5–15.5)
WBC: 6.3 10*3/uL (ref 4.0–10.5)
nRBC: 0 % (ref 0.0–0.2)

## 2021-07-12 LAB — POCT ACTIVATED CLOTTING TIME
Activated Clotting Time: 221 seconds
Activated Clotting Time: 257 seconds
Activated Clotting Time: 281 seconds
Activated Clotting Time: 335 seconds
Activated Clotting Time: 233 seconds

## 2021-07-12 MED ORDER — ATORVASTATIN CALCIUM 80 MG PO TABS: 80.0000 mg | ORAL_TABLET | Freq: Every day | ORAL | 0 refills | Status: DC

## 2021-07-12 MED ORDER — CLOPIDOGREL BISULFATE 75 MG PO TABS: 75.0000 mg | ORAL_TABLET | Freq: Every day | ORAL | 1 refills | Status: DC

## 2021-07-12 NOTE — Discharge Summary (Addendum)
Discharge Summary    Patient ID: Connor Hunt MRN: [id number]; DOB: [birth year]  Admit date: 07/11/2021 Discharge date: 07/12/2021  PCP:  Kathie Dike, FNP   [facility] Providers Cardiologist:  Little Ishikawa, MD      Discharge Diagnoses    Principal Problem:   Claudication in peripheral vascular disease [facility]) Active Problems:   Hyperlipidemia   TOBACCO ABUSE  Diagnostic Studies/Procedures    PV angiogram: 07/11/21  Procedures Performed:             1.  Ultrasound-guided left common femoral access             2.  Abdominal aortogram/bilateral iliac angiogram/bifemoral runoff             3.  Contralateral access (second-order catheter placement)             4.  Placement of a spider distal protection device right popliteal artery             5 Hawk 1 directional arthrectomy long right SFA CTO             6.  Drug-coated balloon angioplasty right SFA             7.  Nitinol self-expanding stent proximal right SFA             8.  Chocolate balloon angioplasty right popliteal artery   Final Impression: Successful right SFA CTO intervention using Hawk 1 directional atherectomy followed by drug-coated balloon angioplasty, nitinol self-expanding stenting and chocolate balloon angioplasty of the right popliteal artery.  The sheath will be removed once ACT falls below 170 and pressure will be held.  He will be hydrated overnight, discharged home in the morning on aspirin and clopidogrel which she will remain on uninterrupted for at least 6 to 12 months.  We had a long discussion regarding importance of smoking cessation.  Dr. Bjorn Pippin , , his referring cardiologist, was made aware of the results.   Nanetta Batty. MD, [facility] 07/11/2021 11:02 AM _____________   History of Present Illness     Lincon Hunt is a 56 y.o. male with PM history of 30-pack-year tobacco abuse, treated hypertension and hyperlipidemia.  There is no family history for heart disease.  Never had a  heart attack or stroke. He was referred to Dr. Allyson Sabal for PAD from Dr. Bjorn Pippin. He has had right lower extremity lifestyle-limiting claudication for several years.  Recent Doppler studies performed 06/01/2021 revealed a right ABI of 0.51 and left of 0.70 with occluded SFAs bilaterally.  He also has moderate right ICA stenosis.  He was prescribed cilostazol twice daily over the last 3 months with little benefit.  He wished to proceed with endovascular therapy after discussing options with Dr. Allyson Sabal.  Hospital Course     Underwent PV angiogram noted above with successful right SFA CTO intervention using Hawk 1 directional atherectomy followed by drug-coated balloon angioplasty, nitinol self-expanding stenting and chocolate balloon angioplasty of the right popliteal artery. Recommendations for DAPT with ASA/plavix for at least 6 months. No complications noted overnight. Able to ambulate without difficulty. Atorvastatin was increased from 20mg  to 80mg  daily  General: Well developed, well nourished, male appearing in no acute distress. Head: Normocephalic, atraumatic.  Neck: Supple without bruits, JVD. Lungs:  Resp regular and unlabored, CTA. Heart: RRR, S1, S2, no S3, S4, or murmur; no rub. Abdomen: Soft, non-tender, non-distended with normoactive bowel sounds. No hepatomegaly. No rebound/guarding. No obvious abdominal masses. Extremities: No clubbing,  cyanosis, edema. Distal pedal pulses are 2+ bilaterally. Left femoral cath site stable with mild bruising no hematoma Neuro: Alert and oriented X 3. Moves all extremities spontaneously. Psych: Normal affect.   Patient was seen by Dr. Angelena Form and deemed stable for discharge. Follow up arranged in the office. Medications sent to pharmacy of choice.  Did the patient have an acute coronary syndrome (MI, NSTEMI, STEMI, etc) this admission?:  No                               Did the patient have a percutaneous coronary intervention (stent / angioplasty)?:   No.    ____________  Discharge Vitals Blood pressure (!) 143/68, pulse 73, temperature 98.6 F (37 C), temperature source Oral, resp. rate 18, height 5\' 9"  (1.753 m), weight 68 kg, SpO2 97 %.  Filed Weights   07/11/21 0602  Weight: 68 kg    Labs & Radiologic Studies    CBC Recent Labs    07/12/21 0244  WBC 6.3  HGB 11.4*  HCT 34.3*  MCV 101.2*  PLT [id number]   Basic Metabolic Panel Recent Labs    07/12/21 0244  NA 137  K 4.0  CL 110  CO2 18*  GLUCOSE 98  BUN 12  CREATININE 0.95  CALCIUM 8.4*   Liver Function Tests No results for input(s): "AST", "ALT", "ALKPHOS", "BILITOT", "PROT", "ALBUMIN" in the last 72 hours. No results for input(s): "LIPASE", "AMYLASE" in the last 72 hours. High Sensitivity Troponin:   No results for input(s): "TROPONINIHS" in the last 720 hours.  BNP Invalid input(s): "POCBNP" D-Dimer No results for input(s): "DDIMER" in the last 72 hours. Hemoglobin A1C No results for input(s): "HGBA1C" in the last 72 hours. Fasting Lipid Panel Recent Labs    07/12/21 0244  CHOL 98  HDL 32*  LDLCALC 16  TRIG 251*  CHOLHDL 3.1   Thyroid Function Tests No results for input(s): "TSH", "T4TOTAL", "T3FREE", "THYROIDAB" in the last 72 hours.  Invalid input(s): "FREET3" _____________  PERIPHERAL VASCULAR CATHETERIZATION  Result Date: 07/11/2021 Images from the original result were not included.  IL:[id number] LOCATION:  FACILITY: [facility] PHYSICIAN: Quay Burow, M.D. 05/17/1965 DATE OF PROCEDURE:  07/11/2021 DATE OF DISCHARGE: PV Angiogram/Intervention History obtained from chart review.Connor Hunt is a 56 y.o. thin-appearing married Caucasian male father of 2, grandfather of 5 grandchildren who was referred by Dr. Gardiner Rhyme , his cardiologist, for evaluation of symptomatic PAD.  He has a history of 30-pack-year tobacco abuse, treated hypertension and hyperlipidemia.  There is no family history for heart disease.  Never had a heart attack or stroke.  He denies  chest pain or shortness of breath.  He has had right lower extremity lifestyle-limiting claudication for several years.  Recent Doppler studies performed 06/01/2021 revealed a right ABI of 0.51 and left of 0.70 with occluded SFAs bilaterally.  He also has moderate right ICA stenosis.  He was prescribed cilostazol twice daily over the last 3 months with little benefit.  He wishes to proceed with endovascular therapy. Pre Procedure Diagnosis: Peripheral artery disease Post Procedure Diagnosis: Peripheral artery disease Operators: Dr. Quay Burow Procedures Performed:  1.  Ultrasound-guided left common femoral access  2.  Abdominal aortogram/bilateral iliac angiogram/bifemoral runoff  3.  Contralateral access (second-order catheter placement)  4.  Placement of a spider distal protection device right popliteal artery             5 Hawk 1 directional  arthrectomy long right SFA CTO             6.  Drug-coated balloon angioplasty right SFA             7.  Nitinol self-expanding stent proximal right SFA             8.  Chocolate balloon angioplasty right popliteal artery PROCEDURE DESCRIPTION: The patient was brought to the second floor [place] cath lab in the the postabsorptive state. He was not premedicated .  His left groin was prepped and shaved in usual sterile fashion. Xylocaine 1% was used for local anesthesia. A 5 French sheath was inserted into the left common femoral artery using standard Seldinger technique.  A 5 French pigtail catheter was placed in the distal abdominal aorta.  Distal abdominal aortography, bilateral iliac angiography with bifemoral runoff was performed using bolus chase, digital subtraction and step table technique.  Omnipaque dye was used for the entirety of the case (265 cc of contrast total to patient).  Retrograde ordered pressures monitored during the case.  Angiographic Data: 1: Abdominal aorta-fluoroscopic calcified, renal arteries widely patent. 2: Left lower  extremity-total left SFA beginning at the origin and reconstituting the adductor canal by profunda femoris collaterals.  There is three-vessel runoff 3: Right lower extremity-Long right SFA CTO beginning at the origin and extending to the adductor canal.  There was a 90% segmental right popliteal artery stenosis with three-vessel runoff   Mr.Fuhrman has bilateral SFA CTO's.  He is more symptomatic on the right.  We will proceed with attempt at right SFA endovascular therapy. Procedure Description: Contralateral access was obtained with a crossover catheter, Glidewire, 035 stiff wire followed by a 7 [place] 55 cm multipurpose Ansell sheath.  Patient received a total of 15,500 units of heparin with an ending ACT of 233.  Omnipaque dye was used for the entirety of the case.  Retrograde ordered pressures monitored to the case. I was able to cross the CTO with a Viance CTO catheter along with a 0.146 g Shepperd wire.  I then performed PTA with a 2.5 mm x 150 mm long balloon and placed a 5 mm spider distal protection device in the right above-the-knee popliteal artery.  Following this I performed [facility] 1 directional atherectomy of a long right SFA removing a copious amount of atherosclerotic plaque.  I then performed DCB with a 5 mm x 250 mm long overlapped with a 5 mm x 80 mm long impact Admiral balloon resulting in reduction of a long total occlusion to 0% residual with a small linear dissection in the proximal third of the SFA which I treated with a 6 mm x 80 mm long Abbott absolute Pro nitinol self-expanding stent. Completion angiography revealed intercurrent occlusion of the right popliteal artery which I was able to cross with the 6 g lead wire and a quick cross catheter.  I then performed chocolate balloon angioplasty with 3.5 x 80 mm long chocolate balloon of the entire diseased segment restoring antegrade flow.  The patient had dopplerable pulses at the end of the case. He received 600 mg of p.o. clopidogrel at  the end of the case.  The Ansell sheath was then exchanged over a versa core wire for a short 7 [place] sheath which was secured in place. Final Impression: Successful right SFA CTO intervention using Hawk 1 directional atherectomy followed by drug-coated balloon angioplasty, nitinol self-expanding stenting and chocolate balloon angioplasty of the right popliteal artery.  The sheath  will be removed once ACT falls below 170 and pressure will be held.  He will be hydrated overnight, discharged home in the morning on aspirin and clopidogrel which she will remain on uninterrupted for at least 6 to 12 months.  We had a long discussion regarding importance of smoking cessation.  Dr. Bjorn Pippin , , his referring cardiologist, was made aware of the results. Nanetta Batty. MD, [facility] 07/11/2021 11:02 AM    CT CARDIAC SCORING (SELF PAY ONLY)  Addendum Date: 06/28/2021   ADDENDUM REPORT: 06/28/2021 08:51 CLINICAL DATA:  Cardiovascular disease risk stratification CAD screening, high CAD risk, not treadmill candidate EXAM: CT Coronary Calcium Score TECHNIQUE: A gated, non-contrast computed tomography scan of the heart was performed using 41mm slice thickness. Axial images were analyzed on a dedicated workstation. Calcium scoring of the coronary arteries was performed using the Agatston method. FINDINGS: Coronary Calcium Score: Left main: 182 Left anterior descending artery: 841 Left circumflex artery: 1261 Right coronary artery: 92 Total: 2420 Percentile: 99th Pericardium: Normal. Ascending Aorta: Normal caliber. Ascending aorta measures approximately 14mm at the mid ascending aorta measured in an axial plane. Non-cardiac: See separate report from [facility]. IMPRESSION: Coronary calcium score of 2420. This was 99th percentile for age-, race-, and sex-matched controls. RECOMMENDATIONS: Coronary artery calcium (CAC) score is a strong predictor of incident coronary heart disease (CHD) and provides predictive information  beyond traditional risk factors. CAC scoring is reasonable to use in the decision to withhold, postpone, or initiate statin therapy in intermediate-risk or selected borderline-risk asymptomatic adults (age 21-75 years and LDL-C >=70 to <190 mg/dL) who do not have diabetes or established atherosclerotic cardiovascular disease (ASCVD).* In intermediate-risk (10-year ASCVD risk >=7.5% to <20%) adults or selected borderline-risk (10-year ASCVD risk >=5% to <7.5%) adults in whom a CAC score is measured for the purpose of making a treatment decision the following recommendations have been made: If CAC=0, it is reasonable to withhold statin therapy and reassess in 5 to 10 years, as long as higher risk conditions are absent (diabetes mellitus, family history of premature CHD in first degree relatives (males <55 years; females <65 years), cigarette smoking, or LDL >=190 mg/dL). If CAC is 1 to 99, it is reasonable to initiate statin therapy for patients >=66 years of age. If CAC is >=100 or >=75th percentile, it is reasonable to initiate statin therapy at any age. Cardiology referral should be considered for patients with CAC scores >=400 or >=75th percentile. *2018 AHA/ACC/AACVPR/AAPA/ABC/ACPM/ADA/AGS/APhA/ASPC/NLA/PCNA Guideline on the Management of Blood Cholesterol: A Report of the American College of Cardiology/American Heart Association Task Force on Clinical Practice Guidelines. J Am Coll Cardiol. 2019;73(24):3168-3209. Electronically Signed   By: Weston Brass M.D.   On: 06/28/2021 08:51   Result Date: 06/28/2021 EXAM: OVER-READ INTERPRETATION  CT CHEST The following report is a limited chest CT over-read performed by radiologist Dr. Richarda Overlie of [facility] on 06/27/2021. This over-read does not include interpretation of cardiac or coronary anatomy or pathology. The coronary calcium interpretation by the cardiologist is attached. COMPARISON:  CT abdomen 07/13/2011 FINDINGS: Vascular: Extensive  atherosclerotic calcifications involving the visualized thoracic aorta. Mediastinum/Nodes: Visualized mediastinal structures are normal. Small lymph nodes posterior to the distal esophagus have minimally changed since 2013. Lungs/Pleura: 3 mm nodule at the left lung base on sequence 4 image 64 appears to be chronic. Chronic irregular opacity in the lingula on image 57. Stable nodularity which could be related to scarring at the left lung base on sequence 4 image 59 measures  5 mm. 3 mm nodule in the periphery of the right lower lobe on sequence 4 image 40 and age-indeterminate. Additional tiny pleural-based nodular densities are likely incidental. No airspace disease or consolidation in the visualized lungs. Upper Abdomen: Images of the upper abdomen are unremarkable. Musculoskeletal: No acute bone abnormality. IMPRESSION: 1. No acute extracardiac findings. 2. Small scattered pulmonary nodules, many of these nodules are stable since 2013 and compatible with benign findings. Some of these small nodules are age indeterminate. No follow-up needed if patient is low-risk (and has no known or suspected primary neoplasm). Non-contrast chest CT can be considered in 12 months if patient is high-risk. This recommendation follows the consensus statement: Guidelines for Management of Incidental Pulmonary Nodules Detected on CT Images: From the Fleischner Society 2017; Radiology 2017; 284:228-243. 3.  Aortic Atherosclerosis (ICD10-I70.0). Electronically Signed: By: Markus Daft M.D. On: 06/27/2021 15:44    Disposition   Pt is being discharged home today in good condition.  Follow-up Plans & Appointments     Follow-up Information     Donato Heinz, MD Follow up on 07/25/2021.   Specialties: Cardiology, Radiology Why: at 8:40am for your follow up appt Contact information: [address] [place] [address] [place] Follow up on 08/01/2021.   Specialty:  Cardiology Why: at 2:30pm for your follow up dopplers Contact information: [address] [place] [phone]        Lorretta Harp, MD Follow up on 08/05/2021.   Specialties: Cardiology, Radiology Why: at 9:30am for your follow up appt Contact information: [address] [place] [address] [phone]                Discharge Instructions     Call MD for:  difficulty breathing, headache or visual disturbances   Complete by: As directed    Call MD for:  persistant dizziness or light-headedness   Complete by: As directed    Call MD for:  redness, tenderness, or signs of infection (pain, swelling, redness, odor or green/yellow discharge around incision site)   Complete by: As directed    Diet - low sodium heart healthy   Complete by: As directed    Discharge instructions   Complete by: As directed    Groin Site Care Refer to this sheet in the next few weeks. These instructions provide you with information on caring for yourself after your procedure. Your caregiver may also give you more specific instructions. Your treatment has been planned according to current medical practices, but problems sometimes occur. Call your caregiver if you have any problems or questions after your procedure. HOME CARE INSTRUCTIONS You may shower 24 hours after the procedure. Remove the bandage (dressing) and gently wash the site with plain soap and water. Gently pat the site dry.  Do not apply powder or lotion to the site.  Do not sit in a bathtub, swimming pool, or whirlpool for 5 to 7 days.  No bending, squatting, or lifting anything over 10 pounds (4.5 kg) as directed by your caregiver.  Inspect the site at least twice daily.  Do not drive home if you are discharged the same day of the procedure. Have someone else drive you.  You may drive 24 hours after the procedure unless otherwise instructed by your caregiver.  What to  expect: Any bruising will usually fade within 1 to 2 weeks.  Blood that collects  in the tissue (hematoma) may be painful to the touch. It should usually decrease in size and tenderness within 1 to 2 weeks.  SEEK IMMEDIATE MEDICAL CARE IF: You have unusual pain at the groin site or down the affected leg.  You have redness, warmth, swelling, or pain at the groin site.  You have drainage (other than a small amount of blood on the dressing).  You have chills.  You have a fever or persistent symptoms for more than 72 hours.  You have a fever and your symptoms suddenly get worse.  Your leg becomes pale, cool, tingly, or numb.  You have heavy bleeding from the site. Hold pressure on the site.   PLEASE DO NOT MISS ANY DOSES OF YOUR PLAVIX!!!!! Also keep a log of you blood pressures and bring back to your follow up appt. Please call the office with any questions.   Patients taking blood thinners should generally stay away from medicines like ibuprofen, Advil, Motrin, naproxen, and Aleve due to risk of stomach bleeding. You may take Tylenol as directed or talk to your primary doctor about alternatives.   PLEASE ENSURE THAT YOU DO NOT RUN OUT OF YOUR PLAVIX. This medication is very important to remain on for at least 33months. IF you have issues obtaining this medication due to cost please CALL the office 3-5 business days prior to running out in order to prevent missing doses of this medication.   Increase activity slowly   Complete by: As directed        Discharge Medications   Allergies as of 07/12/2021   No Known Allergies      Medication List     TAKE these medications    amLODipine 10 MG tablet Commonly known as: NORVASC Take 10 mg by mouth daily.   aspirin EC 81 MG tablet Take 81 mg by mouth daily. Swallow whole.   atorvastatin 80 MG tablet Commonly known as: LIPITOR Take 1 tablet (80 mg total) by mouth daily. Start taking on: July 13, 2021 What changed:  medication  strength how much to take   clopidogrel 75 MG tablet Commonly known as: PLAVIX Take 1 tablet (75 mg total) by mouth daily with breakfast. Start taking on: July 13, 2021   lisinopril 40 MG tablet Commonly known as: ZESTRIL Take 40 mg by mouth daily.   metoprolol tartrate 25 MG tablet Commonly known as: LOPRESSOR Take 1 tablet (25 mg total) by mouth 2 (two) times daily.         Outstanding Labs/Studies   FLP/LFTs in 8 weeks  Duration of Discharge Encounter   Greater than 30 minutes including physician time.  Signed, Laverda Page, NP 07/12/2021, 10:28 AM  I have personally seen and examined this patient. I agree with the assessment and plan as outlined above.  Doing well post PV intervention (right SFA atherectomy and stenting, Popliteal artery angioplasty) No complaints.  D/c home today as above on ASA and Plavix.  Verne Carrow 07/12/2021 11:08 AM

## 2021-07-12 NOTE — TOC Transition Note (Signed)
Transition of Care [facility]) - CM/SW Discharge Note   Patient Details  Name: Connor Hunt MRN: [id number] Date of Birth: December 24, 1965  Transition of Care [facility] CM/SW Contact:  Kermit Balo, RN Phone Number: 07/12/2021, 10:59 AM   Clinical Narrative:    Pt is discharging home with self care. No needs per TOC.   Final next level of care: Home/Self Care Barriers to Discharge: No Barriers Identified   Patient Goals and CMS Choice        Discharge Placement                       Discharge Plan and Services                                     Social Determinants of Health (SDOH) Interventions     Readmission Risk Interventions     No data to display

## 2021-07-14 ENCOUNTER — Ambulatory Visit: Payer: No Typology Code available for payment source | Admitting: Cardiovascular Disease
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
## 2021-07-25 ENCOUNTER — Encounter: Payer: Self-pay | Admitting: Cardiology

## 2021-07-25 ENCOUNTER — Ambulatory Visit (INDEPENDENT_AMBULATORY_CARE_PROVIDER_SITE_OTHER): Payer: No Typology Code available for payment source | Admitting: Cardiology

## 2021-07-25 VITALS — BP 115/72 | HR 65 | Ht 69.0 in | Wt 151.4 lb

## 2021-07-25 DIAGNOSIS — I10 Essential (primary) hypertension: Secondary | ICD-10-CM

## 2021-07-25 DIAGNOSIS — R55 Syncope and collapse: Secondary | ICD-10-CM | POA: Diagnosis not present

## 2021-07-25 DIAGNOSIS — R931 Abnormal findings on diagnostic imaging of heart and coronary circulation: Secondary | ICD-10-CM | POA: Diagnosis not present

## 2021-07-25 DIAGNOSIS — I6523 Occlusion and stenosis of bilateral carotid arteries: Secondary | ICD-10-CM

## 2021-07-25 DIAGNOSIS — I739 Peripheral vascular disease, unspecified: Secondary | ICD-10-CM

## 2021-07-25 DIAGNOSIS — I471 Supraventricular tachycardia: Secondary | ICD-10-CM | POA: Diagnosis not present

## 2021-07-25 DIAGNOSIS — Z720 Tobacco use: Secondary | ICD-10-CM

## 2021-07-25 DIAGNOSIS — E785 Hyperlipidemia, unspecified: Secondary | ICD-10-CM

## 2021-07-25 MED ORDER — ATORVASTATIN CALCIUM 40 MG PO TABS: 40.0000 mg | ORAL_TABLET | Freq: Every day | ORAL | 3 refills | Status: DC

## 2021-07-25 NOTE — Patient Instructions (Addendum)
Medication Instructions:  Your physician has recommended you make the following change in your medication:  DECREASE: Lipitor 40 mg once daily *If you need a refill on your cardiac medications before your next appointment, please call your pharmacy*   Lab Work: None If you have labs (blood work) drawn today and your tests are completely normal, you will receive your results only by: MyChart Message (if you have MyChart) OR A paper copy in the mail If you have any lab test that is abnormal or we need to change your treatment, we will call you to review the results.   Testing/Procedures: How to Prepare for Your Cardiac PET/CT Stress Test:  1. Please do not take these medications before your test:   Medications that may interfere with the cardiac pharmacological stress agent (ex. nitrates - including erectile dysfunction medications or beta-blockers) the day of the exam. (Erectile dysfunction medication should be held for at least 72 hrs prior to test) Theophylline containing medications for 12 hours. Dipyridamole 48 hours prior to the test. Your remaining medications may be taken with water.  2. Nothing to eat or drink, except water, 3 hours prior to arrival time.   NO caffeine/decaffeinated products, or chocolate 12 hours prior to arrival.  3. NO perfume, cologne or lotion  4. Total time is 1 to 2 hours; you may want to bring reading material for the waiting time.  5. Please report to Admitting at the [facility] Main Entrance 60 minutes early for your test.  [address] [place], [place] [address]  Diabetic Preparation:  Hold oral medications. You may take NPH and Lantus insulin. Do not take Humalog or Humulin R (Regular Insulin) the day of your test. Check blood sugars prior to leaving the house. If able to eat breakfast prior to 3 hour fasting, you may take all medications, including your insulin, Do not worry if you miss your breakfast dose of insulin - start  at your next meal.  IF YOU THINK YOU MAY BE PREGNANT, OR ARE NURSING PLEASE INFORM THE TECHNOLOGIST.  In preparation for your appointment, medication and supplies will be purchased.  Appointment availability is limited, so if you need to cancel or reschedule, please call the Radiology Department at [phone]  24 hours in advance to avoid a cancellation fee of $100.00  What to Expect After you Arrive:  Once you arrive and check in for your appointment, you will be taken to a preparation room within the Radiology Department.  A technologist or Nurse will obtain your medical history, verify that you are correctly prepped for the exam, and explain the procedure.  Afterwards,  an IV will be started in your arm and electrodes will be placed on your skin for EKG monitoring during the stress portion of the exam. Then you will be escorted to the PET/CT scanner.  There, staff will get you positioned on the scanner and obtain a blood pressure and EKG.  During the exam, you will continue to be connected to the EKG and blood pressure machines.  A small, safe amount of a radioactive tracer will be injected in your IV to obtain a series of pictures of your heart along with an injection of a stress agent.    After your Exam:  It is recommended that you eat a meal and drink a caffeinated beverage to counter act any effects of the stress agent.  Drink plenty of fluids for the remainder of the day and urinate frequently for the  first couple of hours after the exam.  Your doctor will inform you of your test results within 7-10 business days.  For questions about your test or how to prepare for your test, please call: Rockwell Alexandria, Cardiac Imaging Nurse Navigator  Larey Brick, Cardiac Imaging Nurse Navigator Office: [phone].   Follow-Up: At [facility], you and your health needs are our priority.  As part of our continuing mission to provide you with exceptional heart care, we have created designated  Provider Care Teams.  These Care Teams include your primary Cardiologist (physician) and Advanced Practice Providers (APPs -  Physician Assistants and Nurse Practitioners) who all work together to provide you with the care you need, when you need it.  We recommend signing up for the patient portal called "MyChart".  Sign up information is provided on this After Visit Summary.  MyChart is used to connect with patients for Virtual Visits (Telemedicine).  Patients are able to view lab/test results, encounter notes, upcoming appointments, etc.  Non-urgent messages can be sent to your provider as well.   To learn more about what you can do with MyChart, go to [email or web address].    Your next appointment:   3 month(s)  The format for your next appointment:   In Person  Provider:   Little Ishikawa, MD     Other Instructions   Important Information About Sugar

## 2021-08-01 ENCOUNTER — Ambulatory Visit (HOSPITAL_COMMUNITY)
Admission: RE | Admit: 2021-08-01 | Discharge: 2021-08-01 | Disposition: A | Discharge status: Home / Self Care | Payer: No Typology Code available for payment source | Source: Ambulatory Visit | Attending: Cardiovascular Disease | Admitting: Cardiovascular Disease

## 2021-08-01 DIAGNOSIS — I739 Peripheral vascular disease, unspecified: Secondary | ICD-10-CM | POA: Diagnosis not present

## 2021-08-05 ENCOUNTER — Ambulatory Visit (INDEPENDENT_AMBULATORY_CARE_PROVIDER_SITE_OTHER): Payer: No Typology Code available for payment source | Admitting: Cardiovascular Disease

## 2021-08-05 ENCOUNTER — Encounter: Payer: Self-pay | Admitting: Cardiovascular Disease

## 2021-08-05 VITALS — BP 146/79 | HR 66 | Ht 69.0 in | Wt 153.6 lb

## 2021-08-05 DIAGNOSIS — I739 Peripheral vascular disease, unspecified: Secondary | ICD-10-CM | POA: Diagnosis not present

## 2021-08-05 MED ORDER — HYDROCHLOROTHIAZIDE 12.5 MG PO TABS: 12.5000 mg | ORAL_TABLET | Freq: Every day | ORAL | 2 refills | Status: DC

## 2021-08-05 NOTE — Assessment & Plan Note (Signed)
Connor Hunt returns today post peripheral angiogram performed 07/11/2021 because of lifestyle-limiting claudication.  He had a total right SFA and high-grade popliteal stenosis.

## 2021-08-05 NOTE — Progress Notes (Signed)
08/05/2021 Connor Hunt   11-27-1965  [id number]  Primary Physician Kathie Dike, FNP Primary Cardiologist: Runell Gess MD Nicholes Calamity, [place]  HPI:  Connor Hunt is a 56 y.o.  thin-appearing married Caucasian male father of 2, grandfather of 5 grandchildren who was referred by Dr. Bjorn Pippin , his cardiologist, for evaluation of symptomatic PAD.  He is accompanied by his wife Samara Deist  today.  I last saw him in the office 06/16/2021.  He has a history of 30-pack-year tobacco abuse, treated hypertension and hyperlipidemia.  There is no family history for heart disease.  Never had a heart attack or stroke.  He denies chest pain or shortness of breath.  He has had right lower extremity lifestyle-limiting claudication for several years.  Recent Doppler studies performed 06/01/2021 revealed a right ABI of 0.51 and left of 0.70 with occluded SFAs bilaterally.  He also has moderate right ICA stenosis.  He was prescribed cilostazol twice daily over the last 3 months with little benefit.  He wishes to proceed with endovascular therapy.  I performed peripheral angiography on him 07/11/2021 revealing bilateral SFA CTO's.  I performed directional atherectomy followed by [facility] of his long right SFA CTO as well as stenting of the proximal SFA because of a flow-limiting linear dissection.  He had occlusion of his popliteal artery at the end of the case which required rescue and intervention with a chocolate balloon.  He was discharged home the following day.  Did have a large area of ecchymosis which is since resolved.  His follow-up Dopplers performed 08/01/2021 revealed an increase in his right ABI from 0.51 up to 0.93.     Current Meds  Medication Sig   amLODipine (NORVASC) 10 MG tablet Take 10 mg by mouth daily.   aspirin EC 81 MG tablet Take 81 mg by mouth daily. Swallow whole.   atorvastatin (LIPITOR) 40 MG tablet Take 1 tablet (40 mg total) by mouth daily.   clopidogrel (PLAVIX) 75 MG tablet  Take 1 tablet (75 mg total) by mouth daily with breakfast.   hydrochlorothiazide (HYDRODIURIL) 12.5 MG tablet Take 1 tablet (12.5 mg total) by mouth daily.   lisinopril (ZESTRIL) 40 MG tablet Take 40 mg by mouth daily.   metoprolol tartrate (LOPRESSOR) 25 MG tablet Take 1 tablet (25 mg total) by mouth 2 (two) times daily.     No Known Allergies  Social History   Socioeconomic History   Marital status: Married    Spouse name: Not on file   Number of children: Not on file   Years of education: Not on file   Highest education level: Not on file  Occupational History   Not on file  Tobacco Use   Smoking status: Every Day    Packs/day: 1.00    Types: Cigarettes   Smokeless tobacco: Not on file  Substance and Sexual Activity   Alcohol use: Yes    Alcohol/week: 21.0 standard drinks of alcohol    Types: 21 Cans of beer per week   Drug use: No   Sexual activity: Not on file  Other Topics Concern   Not on file  Social History Narrative   Not on file   Social Determinants of Health   Financial Resource Strain: Not on file  Food Insecurity: Not on file  Transportation Needs: Not on file  Physical Activity: Not on file  Stress: Not on file  Social Connections: Not on file  Intimate Partner Violence: Not on file  Review of Systems: General: negative for chills, fever, night sweats or weight changes.  Cardiovascular: negative for chest pain, dyspnea on exertion, edema, orthopnea, palpitations, paroxysmal nocturnal dyspnea or shortness of breath Dermatological: negative for rash Respiratory: negative for cough or wheezing Urologic: negative for hematuria Abdominal: negative for nausea, vomiting, diarrhea, bright red blood per rectum, melena, or hematemesis Neurologic: negative for visual changes, syncope, or dizziness All other systems reviewed and are otherwise negative except as noted above.    Blood pressure (!) 146/79, pulse 66, height 5\' 9"  (1.753 m), weight 153 lb  9.6 oz (69.7 kg), SpO2 97 %.  General appearance: alert and no distress Neck: no adenopathy, no JVD, supple, symmetrical, trachea midline, thyroid not enlarged, symmetric, no tenderness/mass/nodules, and bilateral carotid bruits Lungs: clear to auscultation bilaterally Heart: regular rate and rhythm, S1, S2 normal, no murmur, click, rub or gallop Extremities: 1-2+ lower extremity edema bilaterally Pulses: 2+ right pedal pulse Skin: Skin color, texture, turgor normal. No rashes or lesions Neurologic: Grossly normal  EKG not performed today  ASSESSMENT AND PLAN:   Claudication in peripheral vascular disease [facility]) Mr. Colaizzi returns today post peripheral angiogram performed 07/11/2021 because of lifestyle-limiting claudication.  He had a total right SFA and high-grade popliteal stenosis.     07/13/2021 MD FACP,FACC,FAHA, [facility] 08/05/2021 10:08 AM

## 2021-08-05 NOTE — Patient Instructions (Signed)
Medication Instructions:   -Start hydrochlorothiazide (hydrodiuril) 12.5mg  once daily.   *If you need a refill on your cardiac medications before your next appointment, please call your pharmacy*   Lab Work: Your physician recommends that you return for lab work in: 2 weeks- BMET  If you have labs (blood work) drawn today and your tests are completely normal, you will receive your results only by: MyChart Message (if you have MyChart) OR A paper copy in the mail If you have any lab test that is abnormal or we need to change your treatment, we will call you to review the results.   Testing/Procedures: Your physician has requested that you have a lower extremity arterial duplex. This test is an ultrasound of the arteries in the legs. It looks at arterial blood flow in the legs. Allow one hour for Lower Arterial scans. There are no restrictions or special instructions  Your physician has requested that you have an ankle brachial index (ABI). During this test an ultrasound and blood pressure cuff are used to evaluate the arteries that supply the arms and legs with blood. Allow thirty minutes for this exam. There are no restrictions or special instructions. To be done in January 2024. These procedures will be done at [address]   Follow-Up: At [facility], you and your health needs are our priority.  As part of our continuing mission to provide you with exceptional heart care, we have created designated Provider Care Teams.  These Care Teams include your primary Cardiologist (physician) and Advanced Practice Providers (APPs -  Physician Assistants and Nurse Practitioners) who all work together to provide you with the care you need, when you need it.  We recommend signing up for the patient portal called "MyChart".  Sign up information is provided on this After Visit Summary.  MyChart is used to connect with patients for Virtual Visits (Telemedicine).  Patients are able to  view lab/test results, encounter notes, upcoming appointments, etc.  Non-urgent messages can be sent to your provider as well.   To learn more about what you can do with MyChart, go to [email or web address].    Your next appointment:   6 month(s)  The format for your next appointment:   In Person  Provider:   Nanetta Batty, MD

## 2021-08-16 ENCOUNTER — Ambulatory Visit: Payer: No Typology Code available for payment source | Admitting: General Practice

## 2021-08-18 ENCOUNTER — Telehealth: Payer: Self-pay | Admitting: *Deleted

## 2021-08-18 NOTE — Telephone Encounter (Signed)
Left a message for the patient to call back to discuss the loop recorder implant.

## 2021-08-19 NOTE — Telephone Encounter (Signed)
Called the patient to discuss the loop recorder implant with Dr. Royann Shivers on 8/7. Instructions have been given and education provided. The patient is concerned about the monthly charge. We will try to find out the cost for him.     You are scheduled for a Loop Recorder Implant on 8/7  at 12:20 with Dr. Royann Shivers. This will take place at [address] [facility], [address].    Please arrive at your appointment 15-20 minutes early.   You do not need to be fasting.   The procedure is performed with local anesthesia. You will not receive sedatives nor will an IV be placed.   Wash your chest and neck with the surgical soap the evening before and the morning of your procedure. Please following the washing instructions provided.   As with all surgical implants, there is a small risk of infection. If an infection occurs, the device will be removed. To help reduce the risk, please use the surgical scrub provided. Additional antiseptic precautions will be taken at the time of the procedure.   Please bring your insurance cards.   You will be scheduled for a two week wound check visit with Dr. Royann Shivers. This will be a video visit. If you are unable to do a video visit then you may come into the office.  *Please note that scheduled loop recorder implants may need to be rescheduled if Dr. Royann Shivers has a procedure urgently added on at the hospital   Preparing for the Procedure  Before the procedure, you can play an important role. Because skin is not sterile, your skin needs to be as free of germs as possible. You can reduce the number of germs on your skin by washing with CHG (chlorhexidine gluconate) Soap before the procedure. CHG is an antiseptic cleaner which kills germs and bonds with the skin to continue killing germs even after washing.  Please do not use if you have an allergy to CHG or antibacterial soaps. If your skin becomes reddened/irritated, STOP using the CHG.  DO NOT SHAVE (including legs and  underarms) for at least 48 hours prior to first CHG shower. It is OK to shave your face.  Please follow these instructions carefully: Shower the night before the procedure and the morning of with CHG Soap. If you chose to wash your hair, wash your hair first as usual with your normal shampoo/conditioner. After you shampoo/condition, rinse you hair and body thoroughly to remove shampoo/conditioner. Use CHG as you would any other liquid soap. You can apply CHG directly to the skin and wash gently with a loofah or a clean washcloth. Apply the CHG Soap to your body ONLY FROM THE NECK DOWN. Do not use on open wounds or open sores. Avoid contact with your eyes, ears, mouth, and genitals (private parts).  Wash thoroughly, paying special attention to the area where your surgery will be performed. Thoroughly rinse your body with warm water from the neck down. DO NOT shower/wash with your normal soap after using and rinsing off the CHG Soap. Pat yourself dry with a clean towel. Wear clean pajamas to bed. Place clean sheets on your bed the night of your first shower and do not sleep with pets..  Day of Surgery: Shower with the CHG Soap following the instructions listed above. DO NOT apply deodorants or lotions.

## 2021-08-22 ENCOUNTER — Encounter: Payer: Self-pay | Admitting: *Deleted

## 2021-08-26 ENCOUNTER — Ambulatory Visit: Payer: No Typology Code available for payment source | Admitting: Cardiology

## 2021-08-29 ENCOUNTER — Ambulatory Visit (INDEPENDENT_AMBULATORY_CARE_PROVIDER_SITE_OTHER): Payer: No Typology Code available for payment source | Admitting: Cardiovascular Disease

## 2021-08-29 DIAGNOSIS — Z95818 Presence of other cardiac implants and grafts: Secondary | ICD-10-CM

## 2021-08-29 DIAGNOSIS — R55 Syncope and collapse: Secondary | ICD-10-CM

## 2021-08-29 MED ORDER — LIDOCAINE-EPINEPHRINE 1 %-1:100000 IJ SOLN: 10.0000 mL | Freq: Once | INTRAMUSCULAR | Status: AC

## 2021-08-29 NOTE — Patient Instructions (Addendum)
Discharge Instructions for  Loop Recorder Explant/Implant    Follow up: Keep your wound check appointment on 09/12/21 at 12:15. This will be a video visit only.  If you have any questions or concerns, please call the office at [phone].  ACTIVITY No restrictions. DO wear your seatbelt, even if it crosses over the site.   WOUND CARE Keep the wound area clean and dry.  Remove the dressing the day after (usually 24 hours after the procedure). DO NOT SUBMERGE UNDER WATER UNTIL FULLY HEALED (no tub baths, hot tubs, swimming pools, etc.).  You  may shower or take a sponge bath after the dressing is removed. DO NOT SOAK the area and do not allow the shower to directly spray on the site. If you have tape/steri-strips on your wound, these will fall off; do not pull them off prematurely.   No bandage is needed on the site.  DO  NOT apply any creams, oils, or ointments to the wound area. If you notice any drainage or discharge from the wound, any swelling, excessive redness or bruising at the site, or if you develop a fever > 101? F, call the office at once at [phone].

## 2021-08-29 NOTE — Progress Notes (Signed)
The patient presented here today for implantable loop recorder to evaluate cause of syncope. Please refer to comprehensive H and P notes from Dr. Bjorn Pippin on 07/25/2021 and Dr. Allyson Sabal 08/05/2021. No changes in health status are reported since then. This procedure has been fully reviewed with the patient and written informed consent has been obtained.

## 2021-08-29 NOTE — Progress Notes (Signed)
LOOP RECORDER IMPLANT   Procedure report  Procedure performed:  Loop recorder implantation   Reason for procedure:  Unexplained syncope Procedure performed by:  Thurmon Fair, MD  Complications:  None  Estimated blood loss:  <5 mL  Medications administered during procedure:  Lidocaine 1% with 1/10,000 epinephrine 10 mL locally Device details:  Medtronic Reveal Linq model number [id number], serial number [id number] Procedure details:  After the risks and benefits of the procedure were discussed the patient provided informed consent. The patient was prepped and draped in usual sterile fashion. Local anesthesia was administered to an area 2 cm to the left of the sternum in the 4th intercostal space. A cutaneous incision was made using the incision tool. The introducer was then used to create a subcutaneous tunnel and carefully deploy the device. Local pressure was held to ensure hemostasis.  The incision was closed with SteriStrips and a sterile dressing was applied.  R waves 0.5 mV  Thurmon Fair, MD, [facility] [phone] office [phone] pager 08/29/2021 12:50 PM

## 2021-09-12 ENCOUNTER — Telehealth (INDEPENDENT_AMBULATORY_CARE_PROVIDER_SITE_OTHER): Payer: No Typology Code available for payment source | Admitting: Cardiovascular Disease

## 2021-09-12 DIAGNOSIS — Z95818 Presence of other cardiac implants and grafts: Secondary | ICD-10-CM

## 2021-09-12 NOTE — Progress Notes (Signed)
Patient video visit following implantation of loop recorder 2 weeks ago. Steri-Strips are still in place, but the site appears healthy without evidence of swelling, redness or drainage.  He has not had fever or chills. Discussed monthly device downloads.

## 2021-09-13 ENCOUNTER — Telehealth: Payer: Self-pay

## 2021-09-13 ENCOUNTER — Other Ambulatory Visit: Payer: Self-pay

## 2021-09-13 ENCOUNTER — Emergency Department (HOSPITAL_COMMUNITY)
Admission: EM | Admit: 2021-09-13 | Discharge: 2021-09-13 | Disposition: A | Discharge status: Home / Self Care | Payer: No Typology Code available for payment source | Attending: Emergency Medicine | Admitting: Emergency Medicine

## 2021-09-13 ENCOUNTER — Encounter (HOSPITAL_COMMUNITY): Payer: Self-pay

## 2021-09-13 ENCOUNTER — Emergency Department (HOSPITAL_COMMUNITY): Payer: No Typology Code available for payment source

## 2021-09-13 DIAGNOSIS — Z7982 Long term (current) use of aspirin: Secondary | ICD-10-CM | POA: Insufficient documentation

## 2021-09-13 DIAGNOSIS — Z79899 Other long term (current) drug therapy: Secondary | ICD-10-CM | POA: Diagnosis not present

## 2021-09-13 DIAGNOSIS — I10 Essential (primary) hypertension: Secondary | ICD-10-CM | POA: Diagnosis not present

## 2021-09-13 DIAGNOSIS — R55 Syncope and collapse: Secondary | ICD-10-CM | POA: Diagnosis present

## 2021-09-13 DIAGNOSIS — Z87891 Personal history of nicotine dependence: Secondary | ICD-10-CM | POA: Diagnosis not present

## 2021-09-13 LAB — CBC WITH DIFFERENTIAL/PLATELET
Abs Immature Granulocytes: 0.03 10*3/uL (ref 0.00–0.07)
Basophils Absolute: 0 10*3/uL (ref 0.0–0.1)
Basophils Relative: 0 %
Eosinophils Absolute: 0.1 10*3/uL (ref 0.0–0.5)
Eosinophils Relative: 1 %
HCT: 40.7 % (ref 39.0–52.0)
Hemoglobin: 13.4 g/dL (ref 13.0–17.0)
Immature Granulocytes: 0 %
Lymphocytes Relative: 15 %
Lymphs Abs: 1 10*3/uL (ref 0.7–4.0)
MCH: 32.4 pg (ref 26.0–34.0)
MCHC: 32.9 g/dL (ref 30.0–36.0)
MCV: 98.5 fL (ref 80.0–100.0)
Monocytes Absolute: 0.5 10*3/uL (ref 0.1–1.0)
Monocytes Relative: 8 %
Neutro Abs: 5 10*3/uL (ref 1.7–7.7)
Neutrophils Relative %: 76 %
Platelets: 210 10*3/uL (ref 150–400)
RBC: 4.13 MIL/uL — ABNORMAL LOW (ref 4.22–5.81)
RDW: 15.3 % (ref 11.5–15.5)
WBC: 6.7 10*3/uL (ref 4.0–10.5)
nRBC: 0 % (ref 0.0–0.2)

## 2021-09-13 LAB — BASIC METABOLIC PANEL
Anion gap: 10 (ref 5–15)
BUN: 16 mg/dL (ref 6–20)
CO2: 24 mmol/L (ref 22–32)
Calcium: 9.1 mg/dL (ref 8.9–10.3)
Chloride: 102 mmol/L (ref 98–111)
Creatinine, Ser: 1.07 mg/dL (ref 0.61–1.24)
GFR, Estimated: >60 mL/min (ref >60)
Glucose, Bld: 107 mg/dL — ABNORMAL HIGH (ref 70–99)
Potassium: 4.4 mmol/L (ref 3.5–5.1)
Sodium: 136 mmol/L (ref 135–145)

## 2021-09-13 LAB — BRAIN NATRIURETIC PEPTIDE: B Natriuretic Peptide: 137.5 pg/mL — ABNORMAL HIGH (ref 0.0–100.0)

## 2021-09-13 LAB — TROPONIN I (HIGH SENSITIVITY): Troponin I (High Sensitivity): 6 ng/L (ref <18)

## 2021-09-13 LAB — CBG MONITORING, ED: Glucose-Capillary: 111 mg/dL — ABNORMAL HIGH (ref 70–99)

## 2021-09-13 NOTE — ED Provider Triage Note (Signed)
Emergency Medicine Provider Triage Evaluation Note  Connor Hunt , a 56 y.o. male  was evaluated in triage.  Pt complains of syncopal event.  Happened earlier today while his wife was driving.  He was unconscious for less than 5 minutes, he was having some twitching of the upper extremity but no urinary incontinence and he did not bite his tongue.  Denies any prodromal chest pain but felt nauseated like he was going to throw up and went pale per wife.  Patient has a loop recorder that was in place as of 2 weeks ago, he is on Plavix but no other blood thinners.  Denies any known history of seizures, he is being followed by cardiology for the syncopal event..  Review of Systems  Per HPI Physical Exam  BP 139/74 (BP Location: Left Arm)   Pulse (!) 59   Temp 97.9 F (36.6 C) (Oral)   Resp 17   Ht 5\' 9"  (1.753 m)   Wt 68.5 kg   SpO2 100%   BMI 22.30 kg/m  Gen:   Awake, no distress   Resp:  Normal effort  MSK:   Moves extremities without difficulty  Other:    Medical Decision Making  Medically screening exam initiated at 11:35 AM.  Appropriate orders placed.  Connor Hunt was informed that the remainder of the evaluation will be completed by another provider, this initial triage assessment does not replace that evaluation, and the importance of remaining in the ED until their evaluation is complete.     Davonna Belling, PA-C 09/13/21 1136

## 2021-09-13 NOTE — Discharge Instructions (Signed)
You were seen in the emergency department after you passed out.  Your EKG showed a normal heart rhythm and your electrolytes were all normal.  Is unclear what the cause of your syncope was and we offered to talk to cardiology while you are in the ER today but you state that you would be able to call them this afternoon.  You should call them once you are home and discuss your symptoms today as well as your work-up in the ER to determine what further evaluation they recommend for you at this time.  You should return to the emergency department if you develop chest pain, you have recurrent episodes of passing out, you have headaches, you have repetitive vomiting, or if you have any other new or concerning symptoms.

## 2021-09-13 NOTE — ED Triage Notes (Addendum)
Pt arrived POV from home c/o a possible seizure or syncopal episode. Pt feels like he passed out in the car per the family member pt blacked out and had seizure like activity. Per family the patient was not shaking but his eyes rolled back in his head and he would not respond for a couple seconds. Pt states he is being followed around for this issue but the family member is the one concerned. Pt has a PET scan order in a couple days he states.

## 2021-09-13 NOTE — ED Provider Notes (Signed)
[facility] EMERGENCY DEPARTMENT Provider Note   CSN: [id number] Arrival date & time: 09/13/21  1001     History  Chief Complaint  Patient presents with   Loss of Consciousness    Connor Hunt is a 56 y.o. male.  Patient is a 56 year old male with a past medical history of PAD, tobacco use and hypertension presenting to the emergency department after a syncopal event.  The patient was riding in the passenger seat of the vehicle while his wife was driving when he started to feel lightheaded and sweaty prior to passing out.  His wife states that he was unconscious for approximately 2 minutes.  She states that she saw some twitching of his arms and legs that his mouth and eyes were opened and he had no clinching.  He denies any tongue or lip bite or urinary or bowel incontinence.  She states that once he came to he was confused for a few seconds to returning to his baseline.  The patient states that this is happened to him twice in the last 6 months.  He states he has been currently evaluated by cardiology and had a temporary monitor placed 2 weeks ago and that he is scheduled for an outpatient cardiac MRI.  He states he has had an echo and extensive work-up as a cause of his syncope and does not yet know the cause.  That he has otherwise been feeling well recently, has had no recent changes to medications, no fevers, no nausea, vomiting or diarrhea.  The history is provided by the patient and the spouse.  Loss of Consciousness      Home Medications Prior to Admission medications   Medication Sig Start Date End Date Taking? Authorizing Provider  amLODipine (NORVASC) 10 MG tablet Take 10 mg by mouth daily. 03/28/21   [provider]  aspirin EC 81 MG tablet Take 81 mg by mouth daily. Swallow whole.    [provider]  atorvastatin (LIPITOR) 40 MG tablet Take 1 tablet (40 mg total) by mouth daily. 07/25/21   Little Ishikawa, MD  clopidogrel  (PLAVIX) 75 MG tablet Take 1 tablet (75 mg total) by mouth daily with breakfast. 07/13/21   Laverda Page B, NP  hydrochlorothiazide (HYDRODIURIL) 12.5 MG tablet Take 1 tablet (12.5 mg total) by mouth daily. 08/05/21   Runell Gess, MD  lisinopril (ZESTRIL) 40 MG tablet Take 40 mg by mouth daily. 04/07/21   [provider]  metoprolol tartrate (LOPRESSOR) 25 MG tablet Take 1 tablet (25 mg total) by mouth 2 (two) times daily. 06/21/21 06/16/22  Little Ishikawa, MD      Allergies    Patient has no known allergies.    Review of Systems   Review of Systems  Cardiovascular:  Positive for syncope.    Physical Exam Updated Vital Signs BP 139/74 (BP Location: Left Arm)   Pulse (!) 59   Temp 97.9 F (36.6 C) (Oral)   Resp 17   Ht 5\' 9"  (1.753 m)   Wt 68.5 kg   SpO2 100%   BMI 22.30 kg/m  Physical Exam Vitals and nursing note reviewed.  Constitutional:      General: He is not in acute distress.    Appearance: Normal appearance.  HENT:     Head: Normocephalic and atraumatic.     Nose: Nose normal.     Mouth/Throat:     Mouth: Mucous membranes are moist.  Eyes:     Extraocular  Movements: Extraocular movements intact.     Conjunctiva/sclera: Conjunctivae normal.  Cardiovascular:     Rate and Rhythm: Normal rate and regular rhythm.     Pulses: Normal pulses.     Heart sounds: Normal heart sounds.  Pulmonary:     Effort: Pulmonary effort is normal.     Breath sounds: Normal breath sounds.  Abdominal:     General: Abdomen is flat.     Tenderness: There is no abdominal tenderness.  Musculoskeletal:        General: Normal range of motion.     Cervical back: Normal range of motion and neck supple.  Skin:    General: Skin is warm and dry.  Neurological:     General: No focal deficit present.     Mental Status: He is alert and oriented to person, place, and time.  Psychiatric:        Mood and Affect: Mood normal.        Behavior: Behavior normal.     ED  Results / Procedures / Treatments   Labs (all labs ordered are listed, but only abnormal results are displayed) Labs Reviewed  CBC WITH DIFFERENTIAL/PLATELET - Abnormal; Notable for the following components:      Result Value   RBC 4.13 (*)    All other components within normal limits  BASIC METABOLIC PANEL - Abnormal; Notable for the following components:   Glucose, Bld 107 (*)    All other components within normal limits  BRAIN NATRIURETIC PEPTIDE - Abnormal; Notable for the following components:   B Natriuretic Peptide 137.5 (*)    All other components within normal limits  CBG MONITORING, ED - Abnormal; Notable for the following components:   Glucose-Capillary 111 (*)    All other components within normal limits  TROPONIN I (HIGH SENSITIVITY)    EKG EKG Interpretation  Date/Time:  Tuesday September 13 2021 11:08:52 EDT Ventricular Rate:  63 PR Interval:  142 QRS Duration: 76 QT Interval:  416 QTC Calculation: 425 R Axis:   41 Text Interpretation: Normal sinus rhythm Normal ECG No significant change since last tracing Confirmed by Arturo Morton ([id number]) on 09/13/2021 12:49:01 PM  Radiology DG Chest 2 View  Result Date: 09/13/2021 CLINICAL DATA:  syncope EXAM: CHEST - 2 VIEW COMPARISON:  None Available. FINDINGS: Cordless loop recorder projects at the left lower chest. The heart size and mediastinal contours are within normal limits. Atheromatous calcifications of the arch of the aorta. Both lungs are clear. The visualized skeletal structures are unremarkable. IMPRESSION: No active cardiopulmonary disease. Electronically Signed   By: Marjo Bicker M.D.   On: 09/13/2021 12:05    Procedures Procedures    Medications Ordered in ED Medications - No data to display  ED Course/ Medical Decision Making/ A&P                           Medical Decision Making This patient presents to the ED with chief complaint(s) of syncope with pertinent past medical history of hypertension,  tobacco use, PAD which further complicates the presenting complaint. The complaint involves an extensive differential diagnosis and also carries with it a high risk of complications and morbidity.    The differential diagnosis includes arrhythmia, electrolyte abnormality, ischemia, seizure unlikely as he did not appear to have any seizure-like shaking, no tongue bite, no incontinence and quickly returned to his baseline, possible vasovagal, dehydration  Additional history obtained: Additional history obtained from spouse Records  reviewed cardiology records  ED Course and Reassessment: Patient was initially evaluated by triage and had labs and EKG performed.  EKG shows normal sinus rhythm without any acute ischemic changes.  His labs are within normal range.  Per his records, cardiology was able to access his monitor and did not see any arrhythmia during this event.  Remainder of his labs are within normal range.  I offered to call his cardiologist for further recommendations but the patient states that he would not like to stay in the ER for cardiology recommendations and would prefer to continue outpatient work-up.  He states that he does have good cardiology follow-up and is able to call them this afternoon.  Dates that he understands his risks as his etiology of syncope is unknown and is agreeable with the plan.  Independent labs interpretation:  The following labs were independently interpreted: Within normal range  Independent visualization of imaging: - I independently visualized the following imaging with scope of interpretation limited to determining acute life threatening conditions related to emergency care: Chest x-ray, which revealed no acute disease  Consultation: - Consulted or discussed management/test interpretation w/ external professional: N/A  Consideration for admission or further workup: Considered cardiology consult for possible admission or further work-up but using shared  decision making with the patient, he prefers outpatient follow-up Social Determinants of health:N/A             Final Clinical Impression(s) / ED Diagnoses Final diagnoses:  None    Rx / DC Orders ED Discharge Orders     None         Phoebe Sharps, DO 09/13/21 1334

## 2021-09-13 NOTE — ED Notes (Signed)
Pt left before this RN could give him his discharge instructions. Also unable to obtain discharge vitals. Pt was ambulatory/ alert and oriented when this RN spoke with him before he left.

## 2021-09-13 NOTE — Telephone Encounter (Signed)
Patient wife called in stating that patient just had a seizure and they want to know if anything was seen on the transmission. Per Leigh nothing was seen and patient needs to go to the ED. Patient and wife both verbalized understanding and states they are going to the ED

## 2021-09-16 ENCOUNTER — Telehealth: Payer: Self-pay | Admitting: *Deleted

## 2021-09-16 DIAGNOSIS — R55 Syncope and collapse: Secondary | ICD-10-CM

## 2021-09-16 NOTE — Telephone Encounter (Signed)
Referral placed to neurology per Dr. Bjorn Pippin.  ED visit 8/24-syncope, possible seizure.

## 2021-09-23 ENCOUNTER — Telehealth (HOSPITAL_COMMUNITY): Payer: Self-pay | Admitting: *Deleted

## 2021-09-23 NOTE — Telephone Encounter (Signed)
Attempted to call patient regarding upcoming cardiac PET appointment and to avoid caffeine for 12 hours prior to appointment. Left message on voicemail with name and callback number  Larey Brick RN Navigator Cardiac Imaging [facility] [phone] Office [phone] Cell

## 2021-09-23 NOTE — Telephone Encounter (Signed)
Calling patient to inform him we will have to cancel his upcoming cardiac imaging appointment due to insurance issues.  Left message with name and call back number.  Larey Brick RN Navigator Cardiac Imaging [facility] [phone] Office [phone] Cell

## 2021-09-27 ENCOUNTER — Ambulatory Visit (HOSPITAL_COMMUNITY): Admission: RE | Admit: 2021-09-27 | Payer: No Typology Code available for payment source | Source: Ambulatory Visit

## 2021-09-27 ENCOUNTER — Encounter (HOSPITAL_COMMUNITY): Payer: Self-pay

## 2021-09-28 ENCOUNTER — Telehealth: Payer: Self-pay | Admitting: Cardiovascular Disease

## 2021-09-28 ENCOUNTER — Encounter: Payer: Self-pay | Admitting: Neurology

## 2021-09-28 DIAGNOSIS — R931 Abnormal findings on diagnostic imaging of heart and coronary circulation: Secondary | ICD-10-CM

## 2021-09-28 NOTE — Telephone Encounter (Signed)
Wife was returning call. Please advise ?

## 2021-09-28 NOTE — Telephone Encounter (Signed)
Wife called stating patient's procedure on 9/5 was canceled by her insurance.  Wife stated this decision can be appealed but would need to be initiated by the cardiologist.  Wife stated that the insurance did not want to pay hospital rates and notes that this procedure is only done at the hospital in this area.  Wife would like assistance with this appeal.

## 2021-09-28 NOTE — Telephone Encounter (Signed)
Spoke to wife (ok per DPR)- aware of recommendations to change PET to lexiscan.   Order placed.  Also discussed with wife that referral to neurology was placed and patient stated he would call back to schedule.   Wife was not aware and she will call to schedule appointment.

## 2021-09-28 NOTE — Telephone Encounter (Signed)
Per Dr. Rockne Menghini to lexiscan myoview  Left message to call back

## 2021-10-03 ENCOUNTER — Ambulatory Visit (INDEPENDENT_AMBULATORY_CARE_PROVIDER_SITE_OTHER): Payer: No Typology Code available for payment source

## 2021-10-03 DIAGNOSIS — R55 Syncope and collapse: Secondary | ICD-10-CM

## 2021-10-04 LAB — CUP PACEART REMOTE DEVICE CHECK
Date Time Interrogation Session: 20230910004531
Implantable Pulse Generator Implant Date: 20230807

## 2021-10-17 ENCOUNTER — Ambulatory Visit (HOSPITAL_COMMUNITY): Payer: PRIVATE HEALTH INSURANCE | Attending: Cardiovascular Disease

## 2021-10-17 DIAGNOSIS — R931 Abnormal findings on diagnostic imaging of heart and coronary circulation: Secondary | ICD-10-CM | POA: Insufficient documentation

## 2021-10-17 MED ORDER — TECHNETIUM TC 99M TETROFOSMIN IV KIT
30.9000 | PACK | Freq: Once | INTRAVENOUS | Status: AC | PRN
  Administered 2021-10-17: 30.9 via INTRAVENOUS

## 2021-10-17 MED ORDER — TECHNETIUM TC 99M TETROFOSMIN IV KIT
10.2000 | PACK | Freq: Once | INTRAVENOUS | Status: AC | PRN
  Administered 2021-10-17: 10.2 via INTRAVENOUS

## 2021-10-17 MED ORDER — REGADENOSON 0.4 MG/5ML IV SOLN
0.4000 mg | Freq: Once | INTRAVENOUS | Status: AC
  Administered 2021-10-17: 0.4 mg via INTRAVENOUS

## 2021-10-18 LAB — MYOCARDIAL PERFUSION IMAGING
LV dias vol: 71 mL (ref 62–150)
LV sys vol: 21 mL
Nuc Stress EF: 70 %
Peak HR: 82 {beats}/min
Rest HR: 60 {beats}/min
Rest Nuclear Isotope Dose: 10.2 mCi
SDS: 3
SRS: 0
SSS: 3
ST Depression (mm): 0 mm
Stress Nuclear Isotope Dose: 30.9 mCi
TID: 1.05

## 2021-10-20 NOTE — Progress Notes (Signed)
Carelink Summary Report / Loop Recorder 

## 2021-11-07 ENCOUNTER — Encounter: Payer: Self-pay | Admitting: Neurology

## 2021-11-07 ENCOUNTER — Ambulatory Visit (INDEPENDENT_AMBULATORY_CARE_PROVIDER_SITE_OTHER): Payer: No Typology Code available for payment source

## 2021-11-07 ENCOUNTER — Ambulatory Visit (INDEPENDENT_AMBULATORY_CARE_PROVIDER_SITE_OTHER): Payer: No Typology Code available for payment source | Admitting: Neurology

## 2021-11-07 VITALS — BP 165/83 | HR 67 | Ht 69.0 in | Wt 158.2 lb

## 2021-11-07 DIAGNOSIS — R55 Syncope and collapse: Secondary | ICD-10-CM
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
## 2021-11-07 NOTE — Patient Instructions (Addendum)
Good to meet you.  Schedule MRI brain with and without contrast  2. Schedule EEG. If this is normal, we will plan for a prolonged home EEG  3. Start slowly cutting down on alcohol since this can cause lowering of blood pressure   4. It is prudent to recommend that all persons should be free of syncopal episodes for at least six months to be granted the driving privilege." ([place], Second Edition, Medical Review Branch, Engineer, site, Division of [employer] of [employer], July 2004)   4. Follow-up after tests, call for any changes

## 2021-11-08 LAB — CUP PACEART REMOTE DEVICE CHECK
Date Time Interrogation Session: 20231013004429
Implantable Pulse Generator Implant Date: 20230807
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
## 2021-11-21 ENCOUNTER — Ambulatory Visit: Payer: No Typology Code available for payment source | Attending: Cardiology | Admitting: Cardiology

## 2021-11-21 ENCOUNTER — Encounter: Payer: Self-pay | Admitting: Cardiology

## 2021-11-21 VITALS — BP 128/70 | HR 68 | Ht 69.0 in | Wt 156.4 lb

## 2021-11-21 DIAGNOSIS — I471 Supraventricular tachycardia, unspecified: Secondary | ICD-10-CM | POA: Diagnosis not present

## 2021-11-21 DIAGNOSIS — I2510 Atherosclerotic heart disease of native coronary artery without angina pectoris: Secondary | ICD-10-CM | POA: Diagnosis not present

## 2021-11-21 DIAGNOSIS — E785 Hyperlipidemia, unspecified: Secondary | ICD-10-CM

## 2021-11-21 DIAGNOSIS — R55 Syncope and collapse: Secondary | ICD-10-CM

## 2021-11-21 DIAGNOSIS — I739 Peripheral vascular disease, unspecified: Secondary | ICD-10-CM

## 2021-11-21 DIAGNOSIS — Z720 Tobacco use: Secondary | ICD-10-CM

## 2021-11-21 DIAGNOSIS — I6523 Occlusion and stenosis of bilateral carotid arteries: Secondary | ICD-10-CM

## 2021-11-21 DIAGNOSIS — I10 Essential (primary) hypertension: Secondary | ICD-10-CM

## 2021-11-21 NOTE — Patient Instructions (Signed)
Medication Instructions:  Your physician recommends that you continue on your current medications as directed. Please refer to the Current Medication list given to you today.  *If you need a refill on your cardiac medications before your next appointment, please call your pharmacy*   Lab Work: Lipid today  If you have labs (blood work) drawn today and your tests are completely normal, you will receive your results only by: [place] (if you have MyChart) OR A paper copy in the mail If you have any lab test that is abnormal or we need to change your treatment, we will call you to review the results.  Follow-Up: At [facility], you and your health needs are our priority.  As part of our continuing mission to provide you with exceptional heart care, we have created designated Provider Care Teams.  These Care Teams include your primary Cardiologist (physician) and Advanced Practice Providers (APPs -  Physician Assistants and Nurse Practitioners) who all work together to provide you with the care you need, when you need it.  We recommend signing up for the patient portal called "MyChart".  Sign up information is provided on this After Visit Summary.  MyChart is used to connect with patients for Virtual Visits (Telemedicine).  Patients are able to view lab/test results, encounter notes, upcoming appointments, etc.  Non-urgent messages can be sent to your provider as well.   To learn more about what you can do with MyChart, go to [email or web address].    Your next appointment:   6 month(s)  The format for your next appointment:   In Person  Provider:   Donato Heinz, MD

## 2021-11-22 LAB — LIPID PANEL
Chol/HDL Ratio: 2.8 ratio (ref 0.0–5.0)
Cholesterol, Total: 160 mg/dL (ref 100–199)
HDL: 58 mg/dL (ref >39)
LDL Chol Calc (NIH): 70 mg/dL (ref 0–99)
Triglycerides: 196 mg/dL — ABNORMAL HIGH (ref 0–149)
VLDL Cholesterol Cal: 32 mg/dL (ref 5–40)

## 2021-11-24 ENCOUNTER — Encounter: Payer: Self-pay | Admitting: *Deleted

## 2021-11-25 MED ORDER — ATORVASTATIN CALCIUM 80 MG PO TABS: 80.0000 mg | ORAL_TABLET | Freq: Every day | ORAL | 3 refills | Status: AC

## 2021-11-25 NOTE — Telephone Encounter (Signed)
Recommend increase atorvastatin to 80 mg daily

## 2021-11-28 ENCOUNTER — Ambulatory Visit (HOSPITAL_COMMUNITY)
Admission: RE | Admit: 2021-11-28 | Discharge: 2021-11-28 | Disposition: A | Discharge status: Home / Self Care | Payer: No Typology Code available for payment source | Source: Ambulatory Visit | Attending: Pain Medicine | Admitting: Pain Medicine

## 2021-11-28 DIAGNOSIS — R55 Syncope and collapse: Secondary | ICD-10-CM | POA: Diagnosis present

## 2021-11-28 NOTE — Progress Notes (Signed)
EEG complete - results pending 

## 2021-11-30 NOTE — Procedures (Signed)
ELECTROENCEPHALOGRAM REPORT  Date of Study: 11/28/2021  Patient's Name: Connor Hunt MRN: [id number] Date of Birth: 1965/07/07  Referring Provider: Dr. Patrcia Dolly  Clinical History: This is a 56 year old man with recurrent syncope. EEG for classification.  Medications: Norvasc, aspirin, Lipitor, Plavix, Lisinopril, Metoprolol  Technical Summary: A multichannel digital EEG recording measured by the international 10-20 system with electrodes applied with paste and impedances below 5000 ohms performed in our laboratory with EKG monitoring in an awake patient.  Hyperventilation and photic stimulation were performed.  The digital EEG was referentially recorded, reformatted, and digitally filtered in a variety of bipolar and referential montages for optimal display.    Description: The patient is awake during the recording.  During maximal wakefulness, there is a symmetric, medium voltage 9-10 Hz posterior dominant rhythm that attenuates with eye opening.  The record is symmetric.  During drowsiness and sleep, there is an increase in theta slowing of the background.  Vertex waves and symmetric sleep spindles were seen. Hyperventilation and photic stimulation did not elicit any abnormalities.  There were no epileptiform discharges or electrographic seizures seen.    EKG lead was unremarkable.  Impression: This awake EEG is normal.    Clinical Correlation: A normal EEG does not exclude a clinical diagnosis of epilepsy.  If further clinical questions remain, prolonged EEG may be helpful.  Clinical correlation is advised.   Patrcia Dolly, M.D.

## 2021-12-01 NOTE — Progress Notes (Signed)
Carelink Summary Report / Loop Recorder 

## 2021-12-07 ENCOUNTER — Telehealth: Payer: Self-pay

## 2021-12-07 NOTE — Telephone Encounter (Signed)
-----   Message from Van Clines, MD sent at 12/02/2021  8:52 AM EST ----- Pls let patient know the EEG is normal, however it is not like a pregnancy test that is positive or negative, just a snapshot of his brain waves. Proceed with brain MRI. We had discussed doing a prolonged 2-day EEG if the short EEG was normal, would he like to schedule that as well? Thanks

## 2021-12-07 NOTE — Telephone Encounter (Signed)
Pt called an informed that the EEG is normal, however it is not like a pregnancy test that is positive or negative, just a snapshot of his brain waves. Proceed with brain MRI. I spoke to him about doing a prolonged 2-day EEG if the short EEG was normal that he had talked about with DR Karel Jarvis, would he like to hold off on that until after doing the MRI and to see what it says,

## 2021-12-12 ENCOUNTER — Ambulatory Visit (INDEPENDENT_AMBULATORY_CARE_PROVIDER_SITE_OTHER): Payer: No Typology Code available for payment source

## 2021-12-12 DIAGNOSIS — R55 Syncope and collapse: Secondary | ICD-10-CM

## 2021-12-13 LAB — CUP PACEART REMOTE DEVICE CHECK
Date Time Interrogation Session: 20231119230937
Implantable Pulse Generator Implant Date: 20230807

## 2022-01-14 ENCOUNTER — Other Ambulatory Visit: Payer: Self-pay | Admitting: Cardiology

## 2022-01-17 ENCOUNTER — Ambulatory Visit (INDEPENDENT_AMBULATORY_CARE_PROVIDER_SITE_OTHER): Payer: No Typology Code available for payment source

## 2022-01-17 DIAGNOSIS — R55 Syncope and collapse: Secondary | ICD-10-CM

## 2022-01-17 LAB — CUP PACEART REMOTE DEVICE CHECK
Date Time Interrogation Session: 20231225231111
Implantable Pulse Generator Implant Date: 20230807

## 2022-01-17 NOTE — Telephone Encounter (Signed)
This is Dr. Schumann's pt 

## 2022-01-20 NOTE — Progress Notes (Addendum)
Carelink Summary Report / Loop Recorder 

## 2022-01-30 ENCOUNTER — Ambulatory Visit (HOSPITAL_COMMUNITY)
Admission: RE | Admit: 2022-01-30 | Payer: PRIVATE HEALTH INSURANCE | Source: Ambulatory Visit | Attending: Cardiovascular Disease | Admitting: Cardiovascular Disease

## 2022-02-13 NOTE — Progress Notes (Signed)
Carelink Summary Report / Loop Recorder

## 2022-02-20 ENCOUNTER — Ambulatory Visit: Payer: PRIVATE HEALTH INSURANCE

## 2022-02-20 DIAGNOSIS — R55 Syncope and collapse: Secondary | ICD-10-CM | POA: Diagnosis not present

## 2022-02-22 LAB — CUP PACEART REMOTE DEVICE CHECK
Date Time Interrogation Session: 20240127230402
Implantable Pulse Generator Implant Date: 20230807

## 2022-03-27 ENCOUNTER — Ambulatory Visit (INDEPENDENT_AMBULATORY_CARE_PROVIDER_SITE_OTHER): Payer: No Typology Code available for payment source

## 2022-03-27 DIAGNOSIS — R55 Syncope and collapse: Secondary | ICD-10-CM | POA: Diagnosis not present

## 2022-03-28 LAB — CUP PACEART REMOTE DEVICE CHECK
Date Time Interrogation Session: 20240303230710
Implantable Pulse Generator Implant Date: 20230807

## 2022-04-07 NOTE — Progress Notes (Signed)
Carelink Summary Report / Loop Recorder 

## 2022-04-28 ENCOUNTER — Telehealth: Payer: Self-pay | Admitting: *Deleted

## 2022-04-28 NOTE — Telephone Encounter (Signed)
Pt has been scheduled for tele pre op appt 05/04/22 @ 9:20. Pt was not able to go over medications at this time, he would like to go over meds during the tele visit. Consent has been given.

## 2022-04-28 NOTE — Telephone Encounter (Signed)
Pt has been scheduled for tele pre op appt 05/04/22 @ 9:20. Pt was not able to go over medications at this time, he would like to go over meds during the tele visit. Consent has been given.     Patient Consent for Virtual Visit        [name] has provided verbal consent on 04/28/2022 for a virtual visit (video or telephone).   CONSENT FOR VIRTUAL VISIT FOR:  Connor Hunt  By participating in this virtual visit I agree to the following:  I hereby voluntarily request, consent and authorize [facility] and its employed or contracted physicians, physician assistants, nurse practitioners or other licensed health care professionals (the Practitioner), to provide me with telemedicine health care services (the "Services") as deemed necessary by the treating Practitioner. I acknowledge and consent to receive the Services by the Practitioner via telemedicine. I understand that the telemedicine visit will involve communicating with the Practitioner through live audiovisual communication technology and the disclosure of certain medical information by electronic transmission. I acknowledge that I have been given the opportunity to request an in-person assessment or other available alternative prior to the telemedicine visit and am voluntarily participating in the telemedicine visit.  I understand that I have the right to withhold or withdraw my consent to the use of telemedicine in the course of my care at any time, without affecting my right to future care or treatment, and that the Practitioner or I may terminate the telemedicine visit at any time. I understand that I have the right to inspect all information obtained and/or recorded in the course of the telemedicine visit and may receive copies of available information for a reasonable fee.  I understand that some of the potential risks of receiving the Services via telemedicine include:  Delay or interruption in medical evaluation due to  technological equipment failure or disruption; Information transmitted may not be sufficient (e.g. poor resolution of images) to allow for appropriate medical decision making by the Practitioner; and/or  In rare instances, security protocols could fail, causing a breach of personal health information.  Furthermore, I acknowledge that it is my responsibility to provide information about my medical history, conditions and care that is complete and accurate to the best of my ability. I acknowledge that Practitioner's advice, recommendations, and/or decision may be based on factors not within their control, such as incomplete or inaccurate data provided by me or distortions of diagnostic images or specimens that may result from electronic transmissions. I understand that the practice of medicine is not an exact science and that Practitioner makes no warranties or guarantees regarding treatment outcomes. I acknowledge that a copy of this consent can be made available to me via my patient portal [facility] MyChart), or I can request a printed copy by calling the office of [facility].    I understand that my insurance will be billed for this visit.   I have read or had this consent read to me. I understand the contents of this consent, which adequately explains the benefits and risks of the Services being provided via telemedicine.  I have been provided ample opportunity to ask questions regarding this consent and the Services and have had my questions answered to my satisfaction. I give my informed consent for the services to be provided through the use of telemedicine in my medical care

## 2022-04-28 NOTE — Telephone Encounter (Signed)
   Pre-operative Risk Assessment    Patient Name: Connor Hunt  DOB: 1965-05-28 MRN: [id number]      Request for Surgical Clearance    Procedure:   COLONOSCOPY  Date of Surgery:  Clearance TBD                                 Surgeon:  DR. Conley Rolls Surgeon's Group or Practice Name:  [facility] Phone number:  [phone] Fax number:  [phone]   Type of Clearance Requested:   - Medical  - Pharmacy:  Hold Clopidogrel (Plavix) X'S 7 DAYS   Type of Anesthesia:  Not Indicated   Additional requests/questions:   THEY ARE ASKING FOR PT TO BE MARKED LOW RISH, MEDIUM RISK, OR HIGH RISH  Signed, Elliot Cousin   04/28/2022, 2:21 PM

## 2022-04-28 NOTE — Telephone Encounter (Signed)
   Name: Connor Hunt  DOB: 08/18/1965  MRN: [id number]  Primary Cardiologist: Little Ishikawa, MD   Preoperative team, please contact this patient and set up a phone call appointment for further preoperative risk assessment. Please obtain consent and complete medication review. Thank you for your help.  I confirm that guidance regarding antiplatelet and oral anticoagulation therapy has been completed and, if necessary, noted below.  His Plavix may be held for 5 to 7 days prior to his procedure.  Please resume as soon as hemostasis is achieved.   Ronney Asters, NP 04/28/2022, 3:08 PM [facility]

## 2022-05-01 ENCOUNTER — Ambulatory Visit: Payer: No Typology Code available for payment source

## 2022-05-01 DIAGNOSIS — R55 Syncope and collapse: Secondary | ICD-10-CM | POA: Diagnosis not present

## 2022-05-01 LAB — CUP PACEART REMOTE DEVICE CHECK
Date Time Interrogation Session: 20240405230242
Implantable Pulse Generator Implant Date: 20230807

## 2022-05-03 NOTE — Progress Notes (Unsigned)
Virtual Visit via Telephone Note   Because of Connor Hunt's co-morbid illnesses, he is at least at moderate risk for complications without adequate follow up.  This format is felt to be most appropriate for this patient at this time.  The patient did not have access to video technology/had technical difficulties with video requiring transitioning to audio format only (telephone).  All issues noted in this document were discussed and addressed.  No physical exam could be performed with this format.  Please refer to the patient's chart for his consent to telehealth for Connor Hunt.  Evaluation Performed:  Preoperative cardiovascular risk assessment _____________   Date:  05/03/2022   Patient ID:  Connor Hunt, DOB 09/18/1965, MRN [id number] Patient Location:  Home Provider location:   Office  Primary Care Provider:  Kathie Dike, FNP Primary Cardiologist:  Little Ishikawa, MD  Chief Complaint / Patient Profile   57 y.o. y/o male with a h/o CAD, SVT, carotid stenosis, PAD s/p angioplasty and stenting right SFA June 2023, hypertension, hyperlipidemia, tobacco abuse, syncope s/p loop recorder implantation August 2023 who is pending colonoscopy by Dr. Conley Rolls and presents today for telephonic preoperative cardiovascular risk assessment.  History of Present Illness    Connor Hunt is a 57 y.o. male who presents via audio/video conferencing for a telehealth visit today.  Pt was last seen in cardiology clinic on 11/21/2021 by Dr. Bjorn Pippin.  At that time Connor Hunt was doing well.  The patient is now pending procedure as outlined above. Since his last visit, he continues to do well stating "I have not felt this good in a couple of years." Patient denies shortness of breath or dyspnea on exertion. No chest pain, pressure, or tightness. Denies lower extremity edema, orthopnea, or PND. No palpitations.  He [place] acres and is very active at home taking care of the yard,  walking most days, and caring for his dogs.   Past Medical History    Past Medical History:  Diagnosis Date   Gastroenteritis    Gout    Past Surgical History:  Procedure Laterality Date   ABDOMINAL AORTOGRAM W/LOWER EXTREMITY Bilateral 07/11/2021   Procedure: ABDOMINAL AORTOGRAM W/LOWER EXTREMITY;  Surgeon: Runell Gess, MD;  Location: [facility];  Service: Cardiovascular;  Laterality: Bilateral;   APPENDECTOMY     LAPAROSCOPIC APPENDECTOMY  03/30/2011   Procedure: APPENDECTOMY LAPAROSCOPIC;  Surgeon: Liz Malady, MD;  Location: [facility];  Service: General;  Laterality: N/A;   PERIPHERAL VASCULAR ATHERECTOMY  07/11/2021   Procedure: PERIPHERAL VASCULAR ATHERECTOMY;  Surgeon: Runell Gess, MD;  Location: [facility];  Service: Cardiovascular;;  Rt SFA   PERIPHERAL VASCULAR BALLOON ANGIOPLASTY  07/11/2021   Procedure: PERIPHERAL VASCULAR BALLOON ANGIOPLASTY;  Surgeon: Runell Gess, MD;  Location: [facility];  Service: Cardiovascular;;  Rt Popilteal    Allergies  No Known Allergies  Home Medications    Prior to Admission medications   Medication Sig Start Date End Date Taking? Authorizing Provider  amLODipine (NORVASC) 10 MG tablet Take 10 mg by mouth daily. 03/28/21   [provider]  aspirin EC 81 MG tablet Take 81 mg by mouth daily. Swallow whole.    [provider]  atorvastatin (LIPITOR) 80 MG tablet Take 1 tablet (80 mg total) by mouth daily. 11/25/21   Little Ishikawa, MD  clopidogrel (PLAVIX) 75 MG tablet TAKE 1 TABLET BY MOUTH DAILY WITH BREAKFAST. 01/17/22   Bjorn Pippin,  Tanna Savoy, MD  ibuprofen (ADVIL) 400 MG tablet Take 400 mg by mouth every 6 (six) hours as needed.    [provider]  lisinopril (ZESTRIL) 40 MG tablet Take 40 mg by mouth daily. 04/07/21   [provider]  metoprolol tartrate (LOPRESSOR) 25 MG tablet Take 1 tablet (25 mg total) by mouth 2 (two) times daily. 06/21/21 06/16/22   Little Ishikawa, MD    Physical Exam    Vital Signs:  Connor Hunt does not have vital signs available for review today.  Given telephonic nature of communication, physical exam is limited. AAOx3. NAD. Normal affect.  Speech and respirations are unlabored.  Accessory Clinical Findings    None  Assessment & Plan    Primary Cardiologist: Little Ishikawa, MD  Preoperative cardiovascular risk assessment.  Colonoscopy by Dr. Conley Rolls on 05/23/2022.  Chart reviewed as part of pre-operative protocol coverage. According to the RCRI, patient has a low (0.4%) risk of MACE. Patient reports activity equivalent to 4.0 METS (yard work on 2 acres, walking daily, caring for dogs).   Given past medical history and time since last visit, based on ACC/AHA guidelines, Connor Hunt would be at acceptable risk for the planned procedure without further cardiovascular testing.   Patient was advised that if he develops new symptoms prior to surgery to contact our office to arrange a follow-up appointment.  he verbalized understanding.  Per office protocol, he may hold Plavix for 5-7 days prior to procedure and should resume as soon as hemodynamically stable postoperatively.   I will route this recommendation to the requesting party via Epic fax function.  Please call with questions.  Time:   Today, I have spent 5 minutes with the patient with telehealth technology discussing medical history, symptoms, and management plan.     Carlos Levering, NP  05/03/2022, 6:48 PM

## 2022-05-04 ENCOUNTER — Ambulatory Visit: Payer: PRIVATE HEALTH INSURANCE | Attending: Cardiology | Admitting: Student

## 2022-05-04 DIAGNOSIS — Z01810 Encounter for preprocedural cardiovascular examination: Secondary | ICD-10-CM

## 2022-05-05 NOTE — Progress Notes (Signed)
Carelink Summary Report / Loop Recorder 

## 2022-05-17 ENCOUNTER — Other Ambulatory Visit: Payer: Self-pay | Admitting: Cardiology

## 2022-06-01 ENCOUNTER — Ambulatory Visit (INDEPENDENT_AMBULATORY_CARE_PROVIDER_SITE_OTHER): Payer: PRIVATE HEALTH INSURANCE

## 2022-06-01 DIAGNOSIS — R55 Syncope and collapse: Secondary | ICD-10-CM | POA: Diagnosis not present

## 2022-06-01 LAB — CUP PACEART REMOTE DEVICE CHECK
Date Time Interrogation Session: 20240508230735
Implantable Pulse Generator Implant Date: 20230807

## 2022-06-07 NOTE — Progress Notes (Signed)
Carelink Summary Report / Loop Recorder 

## 2022-06-20 NOTE — Progress Notes (Signed)
Carelink Summary Report / Loop Recorder 

## 2022-07-03 ENCOUNTER — Ambulatory Visit (INDEPENDENT_AMBULATORY_CARE_PROVIDER_SITE_OTHER): Payer: PRIVATE HEALTH INSURANCE

## 2022-07-03 DIAGNOSIS — R55 Syncope and collapse: Secondary | ICD-10-CM

## 2022-07-03 LAB — CUP PACEART REMOTE DEVICE CHECK
Date Time Interrogation Session: 20240609230417
Implantable Pulse Generator Implant Date: 20230807

## 2022-07-07 ENCOUNTER — Other Ambulatory Visit: Payer: Self-pay | Admitting: Cardiology

## 2022-07-26 NOTE — Progress Notes (Signed)
Carelink Summary Report / Loop Recorder 

## 2022-08-03 ENCOUNTER — Ambulatory Visit (INDEPENDENT_AMBULATORY_CARE_PROVIDER_SITE_OTHER): Payer: PRIVATE HEALTH INSURANCE

## 2022-08-03 DIAGNOSIS — R55 Syncope and collapse: Secondary | ICD-10-CM | POA: Diagnosis not present

## 2022-08-07 LAB — CUP PACEART REMOTE DEVICE CHECK
Date Time Interrogation Session: 20240712230615
Implantable Pulse Generator Implant Date: 20230807

## 2022-08-22 NOTE — Progress Notes (Signed)
Carelink Summary Report / Loop Recorder 

## 2022-09-04 ENCOUNTER — Ambulatory Visit: Payer: PRIVATE HEALTH INSURANCE

## 2022-09-04 DIAGNOSIS — R55 Syncope and collapse: Secondary | ICD-10-CM

## 2022-09-19 NOTE — Progress Notes (Signed)
Carelink Summary Report / Loop Recorder 

## 2022-10-05 ENCOUNTER — Ambulatory Visit (INDEPENDENT_AMBULATORY_CARE_PROVIDER_SITE_OTHER): Payer: PRIVATE HEALTH INSURANCE

## 2022-10-05 DIAGNOSIS — R55 Syncope and collapse: Secondary | ICD-10-CM

## 2022-10-09 LAB — CUP PACEART REMOTE DEVICE CHECK
Date Time Interrogation Session: 20240913230407
Implantable Pulse Generator Implant Date: 20230807

## 2022-10-17 NOTE — Progress Notes (Signed)
Carelink Summary Report / Loop Recorder 

## 2022-10-24 ENCOUNTER — Other Ambulatory Visit: Payer: Self-pay | Admitting: Cardiovascular Disease

## 2022-11-05 IMAGING — CT CT CARDIAC CORONARY ARTERY CALCIUM SCORE
3 series · 13 of 20 positions shown, 15 images · non-contrast
Comparison: CT abdomen 07/13/2011
COMPARISON: CT abdomen 07/13/2011

Addendum:
EXAM:
OVER-READ INTERPRETATION  CT CHEST

The following report is a limited chest CT over-read performed by
This over-read does not include interpretation of cardiac or
coronary anatomy or pathology. The coronary calcium interpretation
by the cardiologist is attached.
CLINICAL DATA: Cardiovascular disease risk stratification
CAD screening, high CAD risk, not treadmill candidate
CT Coronary Calcium Score
TECHNIQUE: A gated, non-contrast computed tomography scan of the heart was
performed using 3mm slice thickness. Axial images were analyzed on a
dedicated workstation. Calcium scoring of the coronary arteries was
performed using the Agatston method.

[Series 2: cascseq 2.0 sa36 70% (id) · axial · 0.39mm/px · z∈[-211,-157]mm · 3 of 68 slices shown]
[im 14/68  vessel]
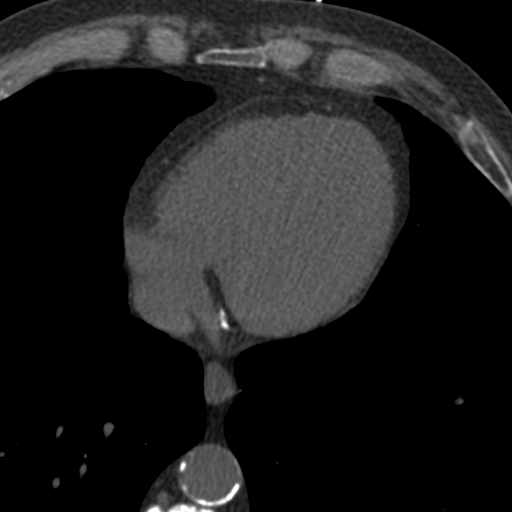
[im 27/68  vessel]
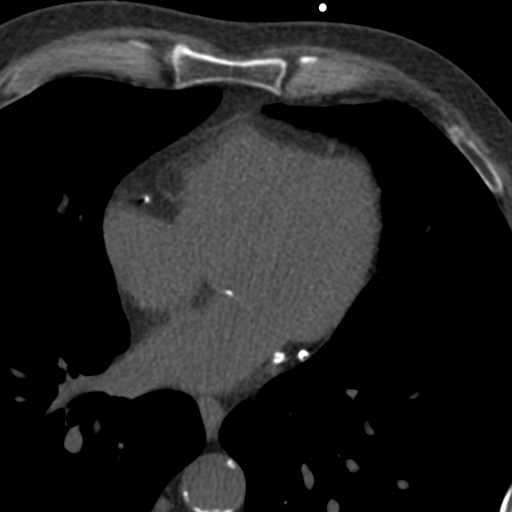
[im 41/68  vessel]
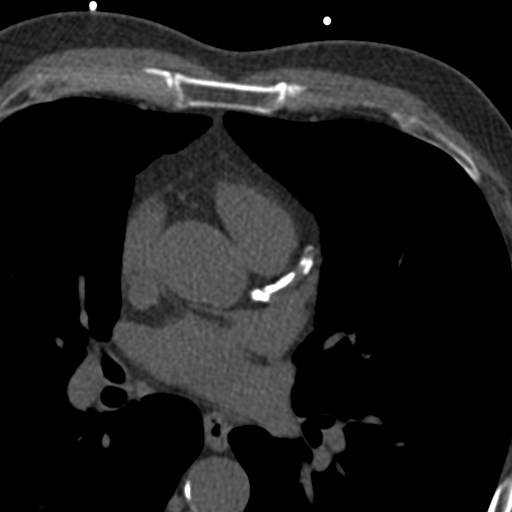

[Series 3: cascseq 2.0 bf37 st · axial · 0.70mm/px · z∈[-215,-127]mm · 5 of 68 slices shown, 7 images]
[im 12/68  vessel]
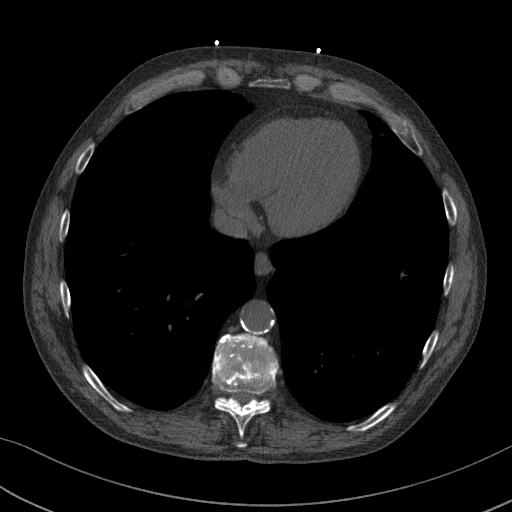
[im 12/68  lung]
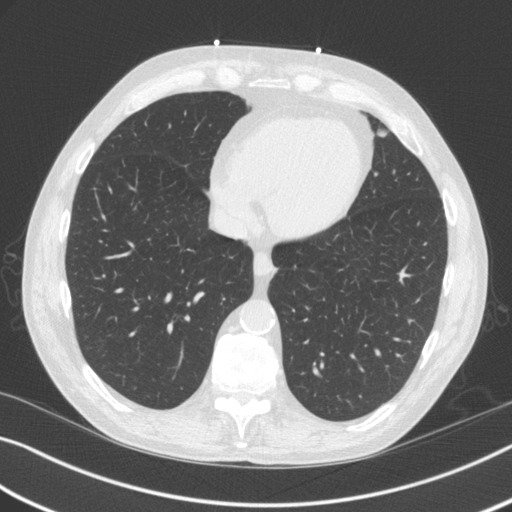
[im 23/68  vessel]
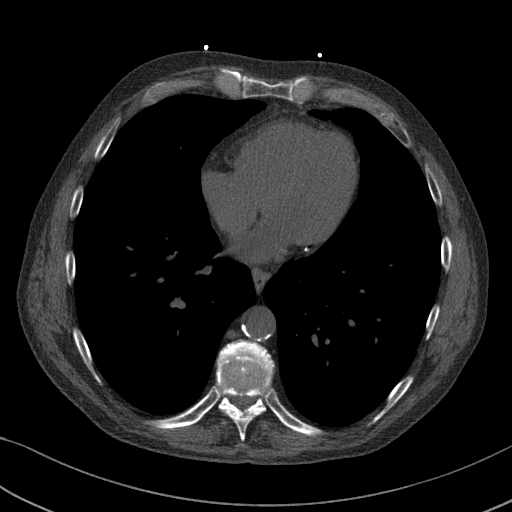
[im 34/68  vessel]
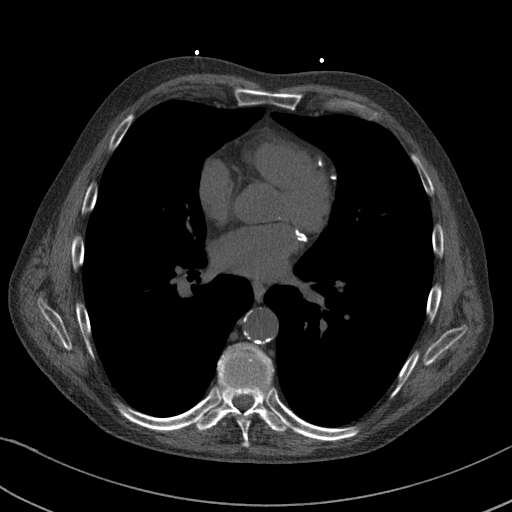
[im 45/68  vessel]
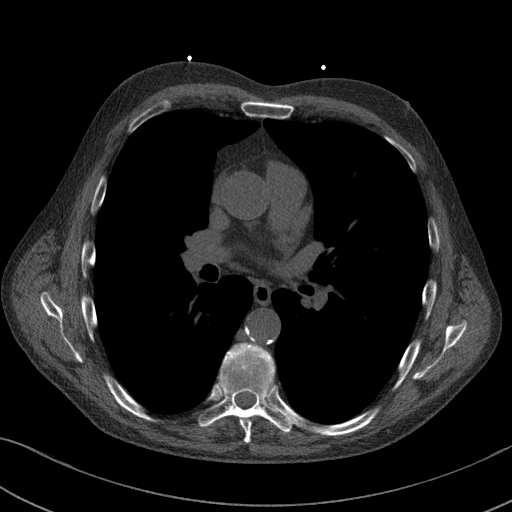
[im 56/68  vessel]
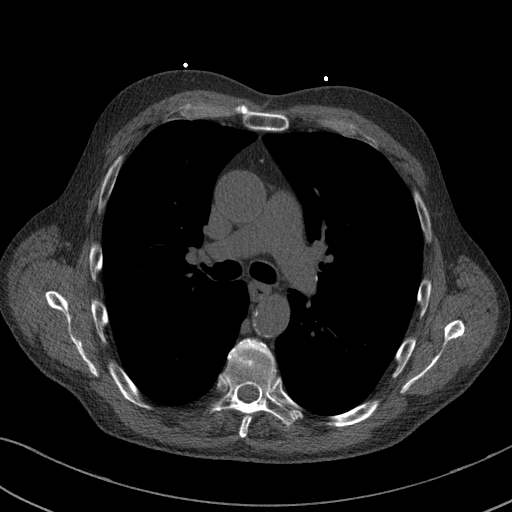
[im 56/68  lung]
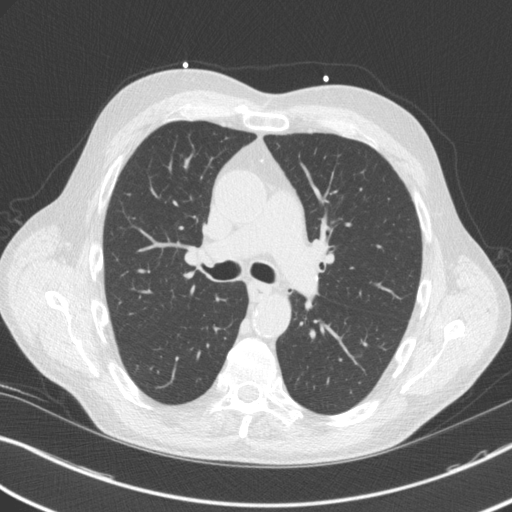

[Series 4: cascseq 2.0 br59 lung · axial · 0.70mm/px · z∈[-215,-127]mm · 5 of 68 slices shown]
[im 12/68  lung]
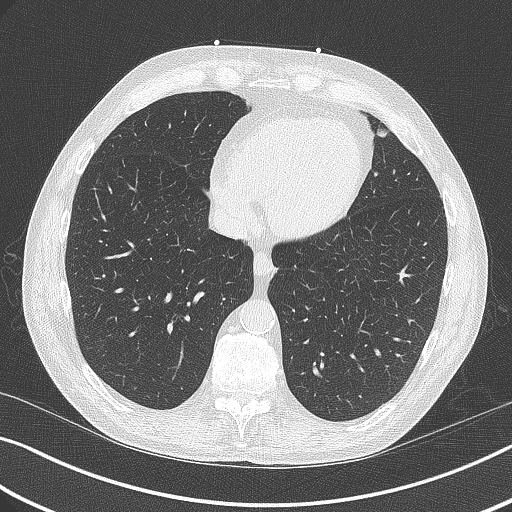
[im 23/68  lung]
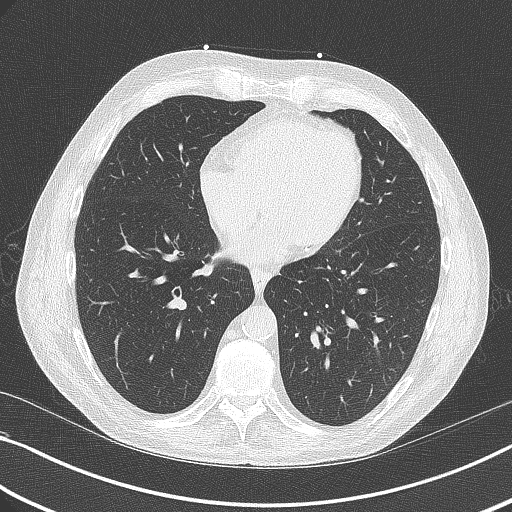
[im 34/68  lung]
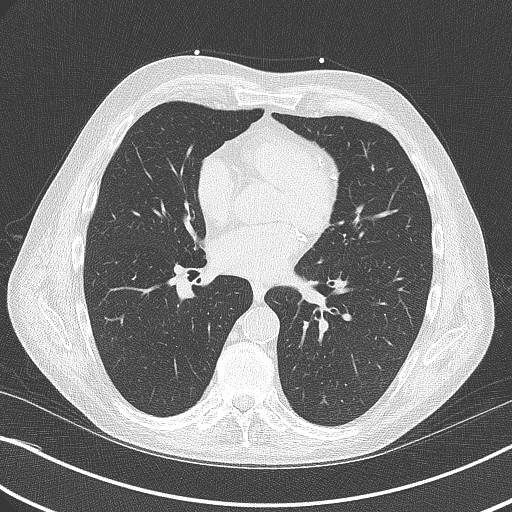
[im 45/68  lung]
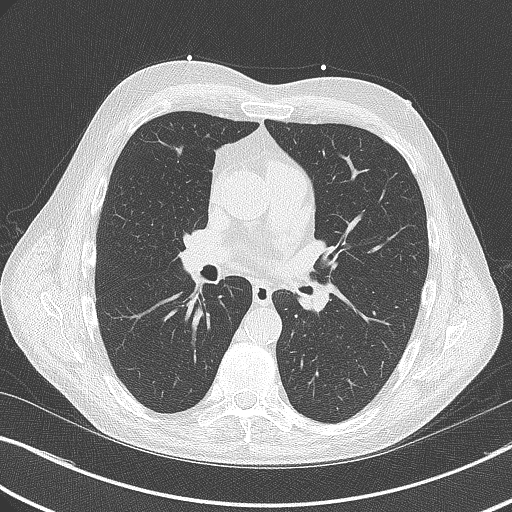
[im 56/68  lung]
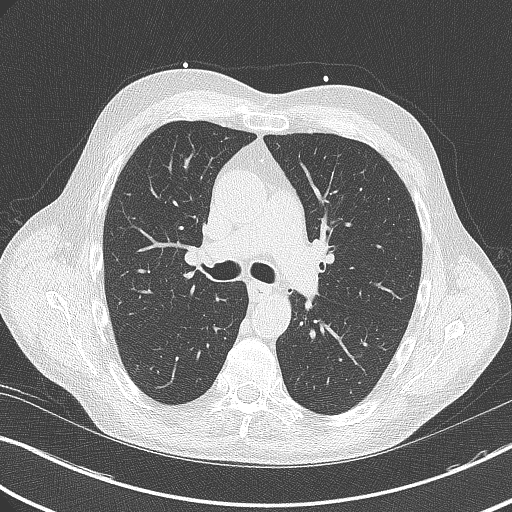

[13 of 20 positions shown; findings below may reference images not displayed]

FINDINGS: Vascular: Extensive atherosclerotic calcifications involving the
visualized thoracic aorta.

Mediastinum/Nodes: Visualized mediastinal structures are normal.
Small lymph nodes posterior to the distal esophagus have minimally
changed since 0394.

Lungs/Pleura: 3 mm nodule at the left lung base on sequence 4 image
64 appears to be chronic. Chronic irregular opacity in the lingula
on image 57. Stable nodularity which could be related to scarring at
the left lung base on sequence 4 image 59 measures 5 mm. 3 mm nodule
in the periphery of the right lower lobe on sequence 4 image 40 and
age-indeterminate. Additional tiny pleural-based nodular densities
are likely incidental. No airspace disease or consolidation in the
visualized lungs.

Upper Abdomen: Images of the upper abdomen are unremarkable.

Musculoskeletal: No acute bone abnormality.
IMPRESSION: 1. No acute extracardiac findings.
2. Small scattered pulmonary nodules, many of these nodules are
stable since 0394 and compatible with benign findings. Some of these
small nodules are age indeterminate. No follow-up needed if patient
is low-risk (and has no known or suspected primary neoplasm).
Non-contrast chest CT can be considered in 12 months if patient is
high-risk. This recommendation follows the consensus statement:
Guidelines for Management of Incidental Pulmonary Nodules Detected
3.  Aortic Atherosclerosis (EISJ0-L5X.X).
FINDINGS: Coronary Calcium Score:

Left main: 182

Left anterior descending artery: 841

Left circumflex artery: 2862

Right coronary artery: 92

Total: 6365

Percentile: 99th

Pericardium: Normal.

Ascending Aorta: Normal caliber. Ascending aorta measures
approximately 35mm at the mid ascending aorta measured in an axial
plane.

Non-cardiac: See separate report from [REDACTED].
IMPRESSION: Coronary calcium score of 6365. This was 99th percentile for age-,
race-, and sex-matched controls.



If CAC=0, it is reasonable to withhold statin therapy and reassess
in 5 to 10 years, as long as higher risk conditions are absent
(diabetes mellitus, family history of premature CHD in first degree
relatives (males <55 years; females <65 years), cigarette smoking,
or LDL >=190 mg/dL).

If CAC is 1 to 99, it is reasonable to initiate statin therapy for
patients >=55 years of age.

If CAC is >=100 or >=75th percentile, it is reasonable to initiate
statin therapy at any age.

Cardiology referral should be considered for patients with CAC
scores >=400 or >=75th percentile.

*5375 AHA/ACC/AACVPR/AAPA/ABC/ZAW/PINGOT/LINECKS/Ave/FELIX JESUS/TATO/RAAM
Guideline on the Management of Blood Cholesterol: A Report of the
American College of Cardiology/American Heart Association Task Force
on Clinical Practice Guidelines. J Am Coll Cardiol.
8208;73(24):1045-1298.

*** End of Addendum ***
EXAM:
OVER-READ INTERPRETATION  CT CHEST

The following report is a limited chest CT over-read performed by
This over-read does not include interpretation of cardiac or
coronary anatomy or pathology. The coronary calcium interpretation
by the cardiologist is attached.
FINDINGS: Vascular: Extensive atherosclerotic calcifications involving the
visualized thoracic aorta.

Mediastinum/Nodes: Visualized mediastinal structures are normal.
Small lymph nodes posterior to the distal esophagus have minimally
changed since 0394.

Lungs/Pleura: 3 mm nodule at the left lung base on sequence 4 image
64 appears to be chronic. Chronic irregular opacity in the lingula
on image 57. Stable nodularity which could be related to scarring at
the left lung base on sequence 4 image 59 measures 5 mm. 3 mm nodule
in the periphery of the right lower lobe on sequence 4 image 40 and
age-indeterminate. Additional tiny pleural-based nodular densities
are likely incidental. No airspace disease or consolidation in the
visualized lungs.

Upper Abdomen: Images of the upper abdomen are unremarkable.

Musculoskeletal: No acute bone abnormality.
IMPRESSION: 1. No acute extracardiac findings.
2. Small scattered pulmonary nodules, many of these nodules are
stable since 0394 and compatible with benign findings. Some of these
small nodules are age indeterminate. No follow-up needed if patient
is low-risk (and has no known or suspected primary neoplasm).
Non-contrast chest CT can be considered in 12 months if patient is
high-risk. This recommendation follows the consensus statement:
Guidelines for Management of Incidental Pulmonary Nodules Detected
3.  Aortic Atherosclerosis (EISJ0-L5X.X).

## 2022-11-08 ENCOUNTER — Ambulatory Visit (INDEPENDENT_AMBULATORY_CARE_PROVIDER_SITE_OTHER): Payer: Self-pay

## 2022-11-08 DIAGNOSIS — R55 Syncope and collapse: Secondary | ICD-10-CM

## 2022-11-08 LAB — CUP PACEART REMOTE DEVICE CHECK
Date Time Interrogation Session: 20241015230802
Implantable Pulse Generator Implant Date: 20230807

## 2022-11-28 NOTE — Progress Notes (Signed)
Carelink Summary Report / Loop Recorder 

## 2022-12-11 ENCOUNTER — Ambulatory Visit (INDEPENDENT_AMBULATORY_CARE_PROVIDER_SITE_OTHER): Payer: Self-pay

## 2022-12-11 DIAGNOSIS — R55 Syncope and collapse: Secondary | ICD-10-CM

## 2022-12-11 LAB — CUP PACEART REMOTE DEVICE CHECK
Date Time Interrogation Session: 20241117232317
Implantable Pulse Generator Implant Date: 20230807

## 2023-01-05 NOTE — Progress Notes (Signed)
Carelink Summary Report / Loop Recorder 

## 2023-01-15 ENCOUNTER — Ambulatory Visit (INDEPENDENT_AMBULATORY_CARE_PROVIDER_SITE_OTHER): Payer: Self-pay

## 2023-01-15 DIAGNOSIS — R55 Syncope and collapse: Secondary | ICD-10-CM

## 2023-01-16 LAB — CUP PACEART REMOTE DEVICE CHECK
Date Time Interrogation Session: 20241222231804
Implantable Pulse Generator Implant Date: 20230807

## 2023-02-19 ENCOUNTER — Ambulatory Visit: Payer: Self-pay

## 2023-02-19 DIAGNOSIS — R55 Syncope and collapse: Secondary | ICD-10-CM

## 2023-02-19 LAB — CUP PACEART REMOTE DEVICE CHECK
Date Time Interrogation Session: 20250126231916
Implantable Pulse Generator Implant Date: 20230807

## 2023-02-24 ENCOUNTER — Encounter: Payer: Self-pay | Admitting: Cardiovascular Disease

## 2023-02-26 NOTE — Progress Notes (Signed)
 [facility] Summary Report / Loop Recorder

## 2023-02-26 NOTE — Addendum Note (Signed)
Addended by: Geralyn Flash D on: 02/26/2023 09:23 AM   Modules accepted: Orders

## 2023-03-26 ENCOUNTER — Ambulatory Visit: Payer: Self-pay

## 2023-03-26 DIAGNOSIS — R55 Syncope and collapse: Secondary | ICD-10-CM

## 2023-03-26 LAB — CUP PACEART REMOTE DEVICE CHECK
Date Time Interrogation Session: 20250302231143
Implantable Pulse Generator Implant Date: 20230807

## 2023-03-27 ENCOUNTER — Encounter: Payer: Self-pay | Admitting: Cardiovascular Disease

## 2023-03-30 NOTE — Progress Notes (Signed)
 [facility] Summary Report / Loop Recorder

## 2023-04-27 NOTE — Progress Notes (Signed)
 [facility] Summary Report / Loop Recorder

## 2023-04-30 ENCOUNTER — Ambulatory Visit (INDEPENDENT_AMBULATORY_CARE_PROVIDER_SITE_OTHER): Payer: Self-pay

## 2023-04-30 DIAGNOSIS — R55 Syncope and collapse: Secondary | ICD-10-CM

## 2023-05-01 ENCOUNTER — Encounter: Payer: Self-pay | Admitting: Cardiovascular Disease

## 2023-05-01 LAB — CUP PACEART REMOTE DEVICE CHECK
Date Time Interrogation Session: 20250406231228
Implantable Pulse Generator Implant Date: 20230807

## 2023-06-04 ENCOUNTER — Ambulatory Visit (INDEPENDENT_AMBULATORY_CARE_PROVIDER_SITE_OTHER): Payer: Self-pay

## 2023-06-04 DIAGNOSIS — R55 Syncope and collapse: Secondary | ICD-10-CM

## 2023-06-05 ENCOUNTER — Ambulatory Visit: Payer: Self-pay | Admitting: Cardiovascular Disease

## 2023-06-05 LAB — CUP PACEART REMOTE DEVICE CHECK
Date Time Interrogation Session: 20250511234138
Implantable Pulse Generator Implant Date: 20230807

## 2023-06-19 NOTE — Progress Notes (Signed)
 [facility] Summary Report / Loop Recorder

## 2023-06-19 NOTE — Addendum Note (Signed)
 Addended by: Edra Govern D on: 06/19/2023 02:32 PM   Modules accepted: Orders

## 2023-07-05 ENCOUNTER — Ambulatory Visit: Payer: Self-pay

## 2023-07-05 DIAGNOSIS — R55 Syncope and collapse: Secondary | ICD-10-CM

## 2023-07-05 LAB — CUP PACEART REMOTE DEVICE CHECK
Date Time Interrogation Session: 20250611232255
Implantable Pulse Generator Implant Date: 20230807

## 2023-07-16 ENCOUNTER — Ambulatory Visit: Payer: Self-pay | Admitting: Cardiovascular Disease

## 2023-07-24 NOTE — Progress Notes (Signed)
 [facility] Summary Report / Loop Recorder

## 2023-07-24 NOTE — Addendum Note (Signed)
 Addended by: TAWNI DRILLING D on: 07/24/2023 09:39 AM   Modules accepted: Orders

## 2023-08-01 ENCOUNTER — Other Ambulatory Visit: Payer: Self-pay | Admitting: Cardiology

## 2023-08-06 ENCOUNTER — Ambulatory Visit (INDEPENDENT_AMBULATORY_CARE_PROVIDER_SITE_OTHER): Payer: Self-pay

## 2023-08-06 DIAGNOSIS — R55 Syncope and collapse: Secondary | ICD-10-CM

## 2023-08-07 ENCOUNTER — Ambulatory Visit: Payer: Self-pay | Admitting: Cardiovascular Disease

## 2023-08-07 LAB — CUP PACEART REMOTE DEVICE CHECK
Date Time Interrogation Session: 20250713234234
Implantable Pulse Generator Implant Date: 20230807

## 2023-08-24 NOTE — Progress Notes (Signed)
 [facility] Summary Report / Loop Recorder

## 2023-08-29 ENCOUNTER — Other Ambulatory Visit: Payer: Self-pay | Admitting: Cardiology

## 2023-09-02 ENCOUNTER — Other Ambulatory Visit: Payer: Self-pay | Admitting: Cardiology

## 2023-09-06 ENCOUNTER — Ambulatory Visit (INDEPENDENT_AMBULATORY_CARE_PROVIDER_SITE_OTHER): Payer: Self-pay

## 2023-09-06 DIAGNOSIS — R55 Syncope and collapse: Secondary | ICD-10-CM

## 2023-09-06 LAB — CUP PACEART REMOTE DEVICE CHECK
Date Time Interrogation Session: 20250813232758
Implantable Pulse Generator Implant Date: 20230807

## 2023-09-07 ENCOUNTER — Ambulatory Visit: Payer: Self-pay | Admitting: Cardiovascular Disease

## 2023-10-08 ENCOUNTER — Ambulatory Visit (INDEPENDENT_AMBULATORY_CARE_PROVIDER_SITE_OTHER): Payer: Self-pay

## 2023-10-08 DIAGNOSIS — R55 Syncope and collapse: Secondary | ICD-10-CM

## 2023-10-09 LAB — CUP PACEART REMOTE DEVICE CHECK
Date Time Interrogation Session: 20250913231435
Implantable Pulse Generator Implant Date: 20230807

## 2023-10-11 ENCOUNTER — Ambulatory Visit: Payer: Self-pay | Admitting: Cardiovascular Disease

## 2023-10-11 ENCOUNTER — Other Ambulatory Visit: Payer: Self-pay | Admitting: Cardiology

## 2023-10-15 NOTE — Progress Notes (Signed)
 Remote Loop Recorder Transmission

## 2023-10-17 NOTE — Progress Notes (Signed)
 Remote Loop Recorder Transmission

## 2023-10-22 ENCOUNTER — Other Ambulatory Visit: Payer: Self-pay | Admitting: Cardiology

## 2023-10-29 ENCOUNTER — Other Ambulatory Visit: Payer: Self-pay | Admitting: Cardiology

## 2023-11-01 NOTE — Progress Notes (Signed)
 Remote Loop Recorder Transmission

## 2023-11-09 ENCOUNTER — Ambulatory Visit: Payer: Self-pay

## 2023-11-09 DIAGNOSIS — R55 Syncope and collapse: Secondary | ICD-10-CM

## 2023-11-11 LAB — CUP PACEART REMOTE DEVICE CHECK
Date Time Interrogation Session: 20251016232308
Implantable Pulse Generator Implant Date: 20230807

## 2023-11-12 ENCOUNTER — Telehealth: Payer: Self-pay

## 2023-11-12 NOTE — Telephone Encounter (Signed)
 Pt returning call

## 2023-11-12 NOTE — Telephone Encounter (Signed)
 ILR summary report: flagging PVC burden 4.1%.  Significant increase in PVC burden per PVC trending,  Taking Metoprolol  tartrate 25mg  bid   LM to return call to assess symptoms.

## 2023-11-13 NOTE — Telephone Encounter (Signed)
 Spoke w/ patient regarding recent increase in PVC burden. Education provided on PVC's and symptoms associated. Patient states he is asymptomatic. States he missed about 1 week of his Metoprolol  d/t issue w/ insurance. He was able to pick up prescription of Metoprolol  on Sunday and has been taking medication. Will schedule transmission in 1 week to reassess PVC burden now that patient is back on Metoprolol .  Patient states he is also out of his Plavix  and was told pharmacy will not refill at this time until patient is seen by his cardiologist. Patient plan to call Dr. Alvan office this morning to see if they can send a prescription in over the phone or schedule appointment.   Informed to contact device clinic for any questions or concerns or if any symptoms arise. Will continue to monitor at this time. Appreciative for call.

## 2023-11-14 ENCOUNTER — Ambulatory Visit: Payer: Self-pay | Admitting: Cardiovascular Disease

## 2023-11-15 NOTE — Progress Notes (Signed)
 Remote Loop Recorder Transmission

## 2023-11-18 ENCOUNTER — Other Ambulatory Visit: Payer: Self-pay | Admitting: Cardiology

## 2023-11-20 ENCOUNTER — Ambulatory Visit: Payer: Self-pay | Attending: Pain Medicine

## 2023-11-22 ENCOUNTER — Other Ambulatory Visit: Payer: Self-pay | Admitting: Cardiology

## 2023-11-23 ENCOUNTER — Other Ambulatory Visit: Payer: Self-pay | Admitting: Cardiology

## 2023-11-23 ENCOUNTER — Telehealth: Payer: Self-pay | Admitting: Cardiology

## 2023-11-23 NOTE — Telephone Encounter (Signed)
 Pt is overdue for an appointment and pt has requested medication too soon. I tried to call pt, but did not get an answer. Left message for pt to call back.

## 2023-11-23 NOTE — Telephone Encounter (Signed)
*  STAT* If patient is at the pharmacy, call can be transferred to refill team.   1. Which medications need to be refilled? (please list name of each medication and dose if known) lisinopril  (ZESTRIL ) 40 MG tablet   metoprolol  tartrate (LOPRESSOR ) 25 MG tablet   2. Which pharmacy/location (including street and city if local pharmacy) is medication to be sent to?  [id number] - [place], [place] - [address]    3. Do they need a 30 day or 90 day supply? 90

## 2023-11-27 ENCOUNTER — Telehealth: Payer: Self-pay | Admitting: Cardiology

## 2023-11-27 MED ORDER — LISINOPRIL 40 MG PO TABS: 40.0000 mg | ORAL_TABLET | Freq: Every day | ORAL | 0 refills | Status: DC

## 2023-11-27 NOTE — Telephone Encounter (Signed)
 Left a message on pt's voicemail he can get Metoprolol  refill at his appt tomorrow, 11/28/23.  I did send in 30 day supply of Lisinopril  and pt can request the year supply at appointment.

## 2023-11-27 NOTE — Telephone Encounter (Signed)
*  STAT* If patient is at the pharmacy, call can be transferred to refill team.   1. Which medications need to be refilled? (please list name of each medication and dose if known) metoprolol  tartrate (LOPRESSOR ) 25 MG tablet    lisinopril  (ZESTRIL ) 40 MG tablet    2. Would you like to learn more about the convenience, safety, & potential cost savings by using the [facility]? no   3. Are you open to using the [facility] (Type [facility].  ).no   4. Which pharmacy/location (including street and city if local pharmacy) is medication to be sent to? [id number] - [place], [place] - [address]     5. Do they need a 30 day or 90 day supply? 30 day   Pt is out of medication
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
## 2023-11-28 ENCOUNTER — Encounter: Payer: Self-pay | Admitting: Cardiology

## 2023-11-28 ENCOUNTER — Ambulatory Visit: Payer: Self-pay | Attending: Cardiology | Admitting: Cardiology

## 2023-11-28 VITALS — BP 156/70 | HR 62 | Ht 69.0 in | Wt 155.1 lb

## 2023-11-28 DIAGNOSIS — I6523 Occlusion and stenosis of bilateral carotid arteries: Secondary | ICD-10-CM | POA: Diagnosis not present

## 2023-11-28 DIAGNOSIS — I739 Peripheral vascular disease, unspecified: Secondary | ICD-10-CM | POA: Diagnosis not present

## 2023-11-28 DIAGNOSIS — I2510 Atherosclerotic heart disease of native coronary artery without angina pectoris: Secondary | ICD-10-CM

## 2023-11-28 DIAGNOSIS — Z720 Tobacco use: Secondary | ICD-10-CM

## 2023-11-28 DIAGNOSIS — E785 Hyperlipidemia, unspecified: Secondary | ICD-10-CM

## 2023-11-28 DIAGNOSIS — I10 Essential (primary) hypertension: Secondary | ICD-10-CM

## 2023-11-28 DIAGNOSIS — R55 Syncope and collapse: Secondary | ICD-10-CM

## 2023-11-28 MED ORDER — CARVEDILOL 6.25 MG PO TABS: 6.2500 mg | ORAL_TABLET | Freq: Two times a day (BID) | ORAL | 1 refills | Status: AC

## 2023-11-28 NOTE — Patient Instructions (Signed)
 Medication Instructions:  Your physician has recommended you make the following change in your medication:   ** Stop Plavix   ** Stop Metoprolol   ** Start Carvedilol 6.25mg  - 1 tablet by mouth twice daily  *If you need a refill on your cardiac medications before your next appointment, please call your pharmacy*  Lab Work: None ordered.  If you have labs (blood work) drawn today and your tests are completely normal, you will receive your results only by: MyChart Message (if you have MyChart) OR A paper copy in the mail If you have any lab test that is abnormal or we need to change your treatment, we will call you to review the results.  Testing/Procedures: Your physician has requested that you have an ankle brachial index (ABI). During this test an ultrasound and blood pressure cuff are used to evaluate the arteries that supply the arms and legs with blood. Allow thirty minutes for this exam. There are no restrictions or special instructions.  Please note: We ask at that you not bring children with you during ultrasound (echo/ vascular) testing. Due to room size and safety concerns, children are not allowed in the ultrasound rooms during exams. Our front office staff cannot provide observation of children in our lobby area while testing is being conducted. An adult accompanying a patient to their appointment will only be allowed in the ultrasound room at the discretion of the ultrasound technician under special circumstances. We apologize for any inconvenience.   Vascular Ultrasound Arterial Duplex Lower extremites  Your physician has requested that you have a carotid duplex. This test is an ultrasound of the carotid arteries in your neck. It looks at blood flow through these arteries that supply the brain with blood. Allow one hour for this exam. There are no restrictions or special instructions.   Follow-Up: At [facility], you and your health needs are our priority.  As part  of our continuing mission to provide you with exceptional heart care, our providers are all part of one team.  This team includes your primary Cardiologist (physician) and Advanced Practice Providers or APPs (Physician Assistants and Nurse Practitioners) who all work together to provide you with the care you need, when you need it.  Your next appointment:   6 months  We recommend signing up for the patient portal called MyChart.  Sign up information is provided on this After Visit Summary.  MyChart is used to connect with patients for Virtual Visits (Telemedicine).  Patients are able to view lab/test results, encounter notes, upcoming appointments, etc.  Non-urgent messages can be sent to your provider as well.   To learn more about what you can do with MyChart, go to [email or web address].   Other Instructions Please check BP daily x 2 weeks and send MyChart message to Dr Kate

## 2023-12-10 ENCOUNTER — Ambulatory Visit: Payer: Self-pay | Attending: Cardiovascular Disease

## 2023-12-10 DIAGNOSIS — R55 Syncope and collapse: Secondary | ICD-10-CM | POA: Diagnosis not present

## 2023-12-10 LAB — CUP PACEART REMOTE DEVICE CHECK
Date Time Interrogation Session: 20251116232710
Implantable Pulse Generator Implant Date: 20230807

## 2023-12-12 NOTE — Progress Notes (Signed)
 Remote Loop Recorder Transmission

## 2023-12-17 ENCOUNTER — Ambulatory Visit: Payer: Self-pay | Admitting: Cardiovascular Disease

## 2023-12-19 ENCOUNTER — Other Ambulatory Visit: Payer: Self-pay | Admitting: Cardiology

## 2024-01-10 ENCOUNTER — Ambulatory Visit: Payer: Self-pay

## 2024-01-10 DIAGNOSIS — R55 Syncope and collapse: Secondary | ICD-10-CM

## 2024-01-10 LAB — CUP PACEART REMOTE DEVICE CHECK
Date Time Interrogation Session: 20251217232859
Implantable Pulse Generator Implant Date: 20230807

## 2024-01-14 NOTE — Progress Notes (Signed)
 Remote Loop Recorder Transmission

## 2024-01-19 ENCOUNTER — Ambulatory Visit: Payer: Self-pay | Admitting: Cardiovascular Disease

## 2024-01-31 ENCOUNTER — Ambulatory Visit (HOSPITAL_COMMUNITY): Payer: Self-pay

## 2024-02-10 ENCOUNTER — Ambulatory Visit: Payer: Self-pay

## 2024-02-10 DIAGNOSIS — R55 Syncope and collapse: Secondary | ICD-10-CM

## 2024-02-11 LAB — CUP PACEART REMOTE DEVICE CHECK
Date Time Interrogation Session: 20260117232912
Implantable Pulse Generator Implant Date: 20230807

## 2024-02-13 ENCOUNTER — Ambulatory Visit: Payer: Self-pay | Admitting: Cardiovascular Disease

## 2024-02-15 NOTE — Progress Notes (Signed)
 Remote Loop Recorder Transmission
